# Patient Record
Sex: Female | Born: 1965 | Race: White | Hispanic: No | Smoking: Current every day smoker
Health system: Southern US, Community
[De-identification: ages and names within clinical notes are randomized; demographics above are authoritative.]

## PROBLEM LIST (undated history)

## (undated) DIAGNOSIS — F419 Anxiety disorder, unspecified: Secondary | ICD-10-CM

## (undated) DIAGNOSIS — G8929 Other chronic pain: Secondary | ICD-10-CM

## (undated) DIAGNOSIS — M549 Dorsalgia, unspecified: Secondary | ICD-10-CM

---

## 2009-10-18 HISTORY — PX: TUBAL LIGATION: SHX77

## 2011-08-03 ENCOUNTER — Emergency Department (HOSPITAL_COMMUNITY)
Admission: EM | Admit: 2011-08-03 | Discharge: 2011-08-04 | Disposition: A | Discharge status: Home / Self Care | Payer: Medicaid Other | Attending: Emergency Medicine | Admitting: Emergency Medicine

## 2011-08-03 ENCOUNTER — Emergency Department (HOSPITAL_COMMUNITY): Payer: Medicaid Other

## 2011-08-03 DIAGNOSIS — F17200 Nicotine dependence, unspecified, uncomplicated: Secondary | ICD-10-CM | POA: Insufficient documentation

## 2011-08-03 DIAGNOSIS — K4190 Unilateral femoral hernia, without obstruction or gangrene, not specified as recurrent: Secondary | ICD-10-CM

## 2011-08-03 LAB — CBC
HCT: 34 % — ABNORMAL LOW (ref 36.0–46.0)
Hemoglobin: 11.7 g/dL — ABNORMAL LOW (ref 12.0–15.0)
MCH: 32.3 pg (ref 26.0–34.0)
MCHC: 34.4 g/dL (ref 30.0–36.0)
RBC: 3.62 MIL/uL — ABNORMAL LOW (ref 3.87–5.11)

## 2011-08-03 LAB — URINALYSIS, ROUTINE W REFLEX MICROSCOPIC
Glucose, UA: NEGATIVE mg/dL
Ketones, ur: NEGATIVE mg/dL
Leukocytes, UA: NEGATIVE
Nitrite: NEGATIVE
Protein, ur: NEGATIVE mg/dL
Urobilinogen, UA: 0.2 mg/dL (ref 0.0–1.0)

## 2011-08-03 LAB — COMPREHENSIVE METABOLIC PANEL
Albumin: 3.9 g/dL (ref 3.5–5.2)
Alkaline Phosphatase: 45 U/L (ref 39–117)
BUN: 16 mg/dL (ref 6–23)
Chloride: 106 mEq/L (ref 96–112)
Creatinine, Ser: 0.69 mg/dL (ref 0.50–1.10)
GFR calc Af Amer: >90 mL/min (ref >90)
GFR calc non Af Amer: >90 mL/min (ref >90)
Glucose, Bld: 106 mg/dL — ABNORMAL HIGH (ref 70–99)
Potassium: 4.3 mEq/L (ref 3.5–5.1)
Total Bilirubin: 0.2 mg/dL — ABNORMAL LOW (ref 0.3–1.2)

## 2011-08-03 LAB — POCT PREGNANCY, URINE: Preg Test, Ur: NEGATIVE

## 2011-08-03 LAB — URINE MICROSCOPIC-ADD ON

## 2011-08-03 LAB — DIFFERENTIAL
Basophils Relative: 0 % (ref 0–1)
Lymphs Abs: 4 10*3/uL (ref 0.7–4.0)
Monocytes Absolute: 1 10*3/uL (ref 0.1–1.0)
Monocytes Relative: 10 % (ref 3–12)
Neutro Abs: 4.5 10*3/uL (ref 1.7–7.7)
Neutrophils Relative %: 46 % (ref 43–77)

## 2011-08-03 MED ORDER — HYDROMORPHONE HCL 2 MG/ML IJ SOLN
1.0000 mg | Freq: Once | INTRAMUSCULAR | Status: AC
  Administered 2011-08-03: 1 mg via INTRAVENOUS
  Filled 2011-08-03: qty 1

## 2011-08-03 MED ORDER — ONDANSETRON HCL 4 MG/2ML IJ SOLN
4.0000 mg | Freq: Once | INTRAMUSCULAR | Status: AC
  Administered 2011-08-03: 4 mg via INTRAVENOUS
  Filled 2011-08-03: qty 2

## 2011-08-03 MED ORDER — IOHEXOL 300 MG/ML  SOLN
100.0000 mL | Freq: Once | INTRAMUSCULAR | Status: AC | PRN
  Administered 2011-08-03: 100 mL via INTRAVENOUS

## 2011-08-03 MED ORDER — SODIUM CHLORIDE 0.9 % IV SOLN
INTRAVENOUS | Status: DC
  Administered 2011-08-03: 22:00:00 via INTRAVENOUS

## 2011-08-03 NOTE — ED Notes (Signed)
Pt reports hernia to her pelvic area.  Pt states that it has been there a while but she has always "pushed it back in".  Pt states that this afternoon pain started in the area that feels like "labor pains".  Pt denies any hematuria.

## 2011-08-03 NOTE — ED Provider Notes (Signed)
History     CSN: [id number] Arrival date & time: 08/03/2011  8:29 PM   First MD Initiated Contact with Patient 08/03/11 2040      Chief Complaint  Patient presents with  . Inguinal Hernia    (Consider location/radiation/quality/duration/timing/severity/associated sxs/prior treatment) HPI Comments: The patient has a hernia in her right groin. This has been present for some time. Usually she is able to massage it back into place, for something she does several times a day about 4 hours ago it became painful and would not go back in. In that period of time, she says the pain has gone from a 4 to an 8. She took 3 Advil without relief. He therefore sought evaluation. She has had 2 prior C-sections in the past.  Patient is a 45 y.o. female presenting with abdominal pain.  Abdominal Pain The primary symptoms of the illness include abdominal pain and nausea. The primary symptoms of the illness do not include fever, vomiting, diarrhea, dysuria or vaginal discharge (she is menstruating at present.). The current episode started 3 to 5 hours ago. The onset of the illness was sudden. The problem has been rapidly worsening.  The patient states that she believes she is currently not pregnant. The patient has not had a change in bowel habit. Risk factors for an acute abdominal problem include a history of abdominal surgery. Additional symptoms associated with the illness include anorexia and back pain. Symptoms associated with the illness do not include chills. Associated medical issues comments: He has a known right groin hernia.Marland Kitchen    History reviewed. No pertinent past medical history.  Past Surgical History  Procedure Date  . Cesarean section     No family history on file.  History  Substance Use Topics  . Smoking status: Current Everyday Smoker  . Smokeless tobacco: Not on file  . Alcohol Use: No    OB History    Grav Para Term Preterm Abortions TAB SAB Ect Mult Living                   Review of Systems  Constitutional: Negative.  Negative for fever and chills.  HENT: Negative.   Eyes: Negative.   Respiratory: Negative.   Cardiovascular: Negative.   Gastrointestinal: Positive for nausea, abdominal pain and anorexia. Negative for vomiting and diarrhea.  Genitourinary: Negative.  Negative for dysuria and vaginal discharge (she is menstruating at present.).  Musculoskeletal: Positive for back pain.  Neurological: Negative.   Psychiatric/Behavioral: Negative.     Allergies  Review of patient's allergies indicates no known allergies.  Home Medications   Current Outpatient Rx  Name Route Sig Dispense Refill  . OMEGA-3 FATTY ACIDS 1000 MG PO CAPS Oral Take 1 g by mouth daily.      . IBUPROFEN 200 MG PO TABS Oral Take 600 mg by mouth as needed. For pain     . WOMENS MULTIVITAMIN PLUS PO Oral Take 1 tablet by mouth daily.        BP 115/70  Pulse 94  Temp(Src) 98.1 F (36.7 C) (Oral)  Resp 18  Ht 5\' 7"  (1.702 m)  Wt 120 lb (54.432 kg)  BMI 18.79 kg/m2  SpO2 100%  LMP 08/03/2011  Physical Exam  Nursing note and vitals reviewed. Constitutional: She is oriented to person, place, and time. She appears well-developed and well-nourished.       Patient has a middle-aged woman in moderate distress with a painful right inguinal hernia.  HENT:  Head: Normocephalic  and atraumatic.  Right Ear: External ear normal.  Left Ear: External ear normal.  Mouth/Throat: Oropharynx is clear and moist.  Eyes: Conjunctivae and EOM are normal. Pupils are equal, round, and reactive to light.  Neck: Normal range of motion. Neck supple.  Cardiovascular: Normal rate, regular rhythm and normal heart sounds.   Pulmonary/Chest: Effort normal and breath sounds normal.  Abdominal: There is Tenderness: she has a right inguinal, about 2 cm in diameter, that is tender to palpation There is no surrounding redness or tenderness in the area surrounding the hernia..  Musculoskeletal: Normal  range of motion.  Lymphadenopathy:    She has no cervical adenopathy.  Neurological: She is alert and oriented to person, place, and time.       No sensory or motor deficits.  Skin: Skin is warm and dry.  Psychiatric: She has a normal mood and affect. Her behavior is normal.    ED Course  Procedures (including critical care time)   Labs Reviewed  CBC  DIFFERENTIAL  COMPREHENSIVE METABOLIC PANEL  URINALYSIS, ROUTINE W REFLEX MICROSCOPIC  POCT PREGNANCY, URINE    9:12 PM Patient was seen and had physical examination, which revealed an incarcerated right inguinal hernia. Hyphae pain medication was ordered. Laboratory tests and abdominal CT were ordered. 9:56 PM Results for orders placed during the hospital encounter of 08/03/11  CBC      Component Value Range   WBC 9.7  4.0 - 10.5 (K/uL)   RBC 3.62 (*) 3.87 - 5.11 (MIL/uL)   Hemoglobin 11.7 (*) 12.0 - 15.0 (g/dL)   HCT 16.1 (*) 09.6 - 46.0 (%)   MCV 93.9  78.0 - 100.0 (fL)   MCH 32.3  26.0 - 34.0 (pg)   MCHC 34.4  30.0 - 36.0 (g/dL)   RDW 04.5  40.9 - 81.1 (%)   Platelets 208  150 - 400 (K/uL)  DIFFERENTIAL      Component Value Range   Neutrophils Relative 46  43 - 77 (%)   Neutro Abs 4.5  1.7 - 7.7 (K/uL)   Lymphocytes Relative 41  12 - 46 (%)   Lymphs Abs 4.0  0.7 - 4.0 (K/uL)   Monocytes Relative 10  3 - 12 (%)   Monocytes Absolute 1.0  0.1 - 1.0 (K/uL)   Eosinophils Relative 2  0 - 5 (%)   Eosinophils Absolute 0.2  0.0 - 0.7 (K/uL)   Basophils Relative 0  0 - 1 (%)   Basophils Absolute 0.0  0.0 - 0.1 (K/uL)  URINALYSIS, ROUTINE W REFLEX MICROSCOPIC      Component Value Range   Color, Urine YELLOW  YELLOW    Appearance CLEAR  CLEAR    Specific Gravity, Urine 1.025  1.005 - 1.030    pH 6.5  5.0 - 8.0    Glucose, UA NEGATIVE  NEGATIVE (mg/dL)   Hgb urine dipstick MODERATE (*) NEGATIVE    Bilirubin Urine NEGATIVE  NEGATIVE    Ketones, ur NEGATIVE  NEGATIVE (mg/dL)   Protein, ur NEGATIVE  NEGATIVE (mg/dL)    Urobilinogen, UA 0.2  0.0 - 1.0 (mg/dL)   Nitrite NEGATIVE  NEGATIVE    Leukocytes, UA NEGATIVE  NEGATIVE   POCT PREGNANCY, URINE      Component Value Range   Preg Test, Ur NEGATIVE    URINE MICROSCOPIC-ADD ON      Component Value Range   Squamous Epithelial / LPF MANY (*) RARE    WBC, UA 0-2  <3 (WBC/hpf)  RBC / HPF 7-10  <3 (RBC/hpf)   Bacteria, UA RARE  RARE    Lab tests relatively normal.  Awaiting rest of labs, CT of abdomen. Case discussed with Dr. Jeraldine Loots, who will followup her results.   No results found.    No results found.   No diagnosis found.    MDM     Patient's CT scan demonstrates a right femoral hernia with fat process, and no bowel involvement. The patient was made aware of these findings, and a prolonged discussion was held regarding analgesia, and the necessity for followup care. She was discharged following an additional dose of analgesia.     Gerhard Munch, MD 08/04/11 0010

## 2011-08-04 MED ORDER — HYDROMORPHONE HCL 2 MG/ML IJ SOLN
1.0000 mg | Freq: Once | INTRAMUSCULAR | Status: DC
  Filled 2011-08-04: qty 1

## 2011-08-04 MED ORDER — HYDROCODONE-ACETAMINOPHEN 5-325 MG PO TABS: 2.0000 | ORAL_TABLET | ORAL | Status: DC | PRN

## 2011-08-04 MED ORDER — HYDROCODONE-ACETAMINOPHEN 5-325 MG PO TABS: 2.0000 | ORAL_TABLET | Freq: Three times a day (TID) | ORAL | Status: AC | PRN

## 2011-08-04 NOTE — ED Notes (Signed)
See downtime forms for discharge 

## 2011-08-19 ENCOUNTER — Other Ambulatory Visit (HOSPITAL_COMMUNITY): Payer: Self-pay | Admitting: Internal Medicine

## 2011-08-19 DIAGNOSIS — Z139 Encounter for screening, unspecified: Secondary | ICD-10-CM

## 2011-08-23 ENCOUNTER — Ambulatory Visit (HOSPITAL_COMMUNITY)
Admission: RE | Admit: 2011-08-23 | Discharge: 2011-08-23 | Disposition: A | Discharge status: Home / Self Care | Payer: Medicaid Other | Source: Ambulatory Visit | Attending: Internal Medicine | Admitting: Internal Medicine

## 2011-08-23 ENCOUNTER — Other Ambulatory Visit (HOSPITAL_COMMUNITY): Payer: Self-pay | Admitting: Internal Medicine

## 2011-08-23 DIAGNOSIS — Z139 Encounter for screening, unspecified: Secondary | ICD-10-CM

## 2011-08-23 DIAGNOSIS — M51379 Other intervertebral disc degeneration, lumbosacral region without mention of lumbar back pain or lower extremity pain: Secondary | ICD-10-CM | POA: Insufficient documentation

## 2011-08-23 DIAGNOSIS — Z1231 Encounter for screening mammogram for malignant neoplasm of breast: Secondary | ICD-10-CM | POA: Insufficient documentation

## 2011-08-23 DIAGNOSIS — K469 Unspecified abdominal hernia without obstruction or gangrene: Secondary | ICD-10-CM

## 2011-08-23 DIAGNOSIS — M545 Low back pain, unspecified: Secondary | ICD-10-CM | POA: Insufficient documentation

## 2011-08-23 DIAGNOSIS — M5137 Other intervertebral disc degeneration, lumbosacral region: Secondary | ICD-10-CM | POA: Insufficient documentation

## 2011-09-01 ENCOUNTER — Encounter (HOSPITAL_COMMUNITY): Payer: Self-pay | Admitting: Pharmacy Technician

## 2011-09-13 ENCOUNTER — Encounter (HOSPITAL_COMMUNITY): Payer: Self-pay

## 2011-09-13 ENCOUNTER — Encounter (HOSPITAL_COMMUNITY)
Admission: RE | Admit: 2011-09-13 | Discharge: 2011-09-13 | Disposition: A | Discharge status: Home / Self Care | Payer: Medicaid Other | Source: Ambulatory Visit | Attending: General Surgery | Admitting: General Surgery

## 2011-09-13 HISTORY — DX: Anxiety disorder, unspecified: F41.9

## 2011-09-13 LAB — SURGICAL PCR SCREEN
MRSA, PCR: NEGATIVE
Staphylococcus aureus: NEGATIVE

## 2011-09-13 LAB — CBC
HCT: 36.5 % (ref 36.0–46.0)
Hemoglobin: 12.3 g/dL (ref 12.0–15.0)
MCH: 31.5 pg (ref 26.0–34.0)
MCHC: 33.7 g/dL (ref 30.0–36.0)
RBC: 3.91 MIL/uL (ref 3.87–5.11)

## 2011-09-13 LAB — BASIC METABOLIC PANEL
BUN: 8 mg/dL (ref 6–23)
CO2: 26 mEq/L (ref 19–32)
Calcium: 9.8 mg/dL (ref 8.4–10.5)
Glucose, Bld: 89 mg/dL (ref 70–99)
Sodium: 138 mEq/L (ref 135–145)

## 2011-09-13 NOTE — Patient Instructions (Addendum)
22 Ana Austin  09/13/2011   Your procedure is scheduled on:  09/15/2011  Report to [facility] at  615  AM.  Call this number if you have problems the morning of surgery: [phone]   Remember:   Do not eat food:After Midnight.  May have clear liquids:until Midnight .  Clear liquids include soda, tea, black coffee, apple or grape juice, broth.  Take these medicines the morning of surgery with A SIP OF WATER: none   Do not wear jewelry, make-up or nail polish.  Do not wear lotions, powders, or perfumes. You may wear deodorant.  Do not shave 48 hours prior to surgery.  Do not bring valuables to the hospital.  Contacts, dentures or bridgework may not be worn into surgery.  Leave suitcase in the car. After surgery it may be brought to your room.  For patients admitted to the hospital, checkout time is 11:00 AM the day of discharge.   Patients discharged the day of surgery will not be allowed to drive home.  Name and phone number of your driver: family  Special Instructions: CHG Shower Use Special Wash: 1/2 bottle night before surgery and 1/2 bottle morning of surgery.   Please read over the following fact sheets that you were given: Pain Booklet, MRSA Information, Surgical Site Infection Prevention, Anesthesia Post-op Instructions and Care and Recovery After Surgery PATIENT INSTRUCTIONS POST-ANESTHESIA  IMMEDIATELY FOLLOWING SURGERY:  Do not drive or operate machinery for the first twenty four hours after surgery.  Do not make any important decisions for twenty four hours after surgery or while taking narcotic pain medications or sedatives.  If you develop intractable nausea and vomiting or a severe headache please notify your doctor immediately.  FOLLOW-UP:  Please make an appointment with your surgeon as instructed. You do not need to follow up with anesthesia unless specifically instructed to do so.  WOUND CARE INSTRUCTIONS (if applicable):  Keep a dry clean dressing on the  anesthesia/puncture wound site if there is drainage.  Once the wound has quit draining you may leave it open to air.  Generally you should leave the bandage intact for twenty four hours unless there is drainage.  If the epidural site drains for more than 36-48 hours please call the anesthesia department.  QUESTIONS?:  Please feel free to call your physician or the hospital operator if you have any questions, and they will be happy to assist you.     [facility] [address] [place] [phone]

## 2011-09-14 NOTE — H&P (Signed)
  NTS SOAP Note  Vital Signs:  Vitals as of: 08/12/2011: Systolic 100: Diastolic 56: Heart Rate 63: Temp 97.54F: Height 45ft 7in: Weight 128Lbs 0 Ounces: Pain Level 0: BMI 20  BMI : 20.05 kg/m2  Subjective: This 26 Years 86 Months old Female presents for of right groin buldge.  Patient has noted a bulge over the last several years.  Slow increase in the size. NO significant issues except for last week when she went to the ED.  Had significant pain at that time.  Apparently was reduced in the ED.  No symptoms since.  No fever or chills.  No change in BM.  Review of Symptoms:  Constitutional:unremarkable Head:unremarkable Eyes:unremarkable Nose/Mouth/Throat:unremarkable Cardiovascular:unremarkable Respiratory:unremarkable as per HPI Genitourinary:unremarkable Musculoskeletal:unremarkable Skin:unremarkable Breast:unremarkable Hematolgic/Lymphatic:unremarkable Allergic/Immunologic:unremarkable   Past Medical History:Obtained   Past Medical History  Pregnancy Gravida: 4 Pregnancy Para: 4 Surgical History: c-section x2 Medical Problems: none Psychiatric History: none Allergies: NKDA Medications: none   Social History:Obtained   Social History  Preferred Language: English ([place]) Race:  White Ethnicity: Not Hispanic / Latino Age: 13 Years 4 Months Marital Status:  M Alcohol: no Recreational drug(s): no   Smoking Status: Unknown if ever smoked  Family History:Obtained   Family History  Is there a family history of:non-contributory    Objective Information: General:Well appearing, well nourished in no distress. Skin:no rash or prominent lesions Head:Atraumatic; no masses; no abnormalities Eyes:conjunctiva clear, EOM intact, PERRL Mouth:Mucous membranes moist, no mucosal lesions. Neck:Supple without lymphadenopathy.  Heart:RRR, no murmur Lungs:CTA bilaterally, no wheezes, rhonchi,  rales.  Breathing unlabored. Abdomen:Soft, NT/ND, no HSM, no masses.+r femoral hernia Extremities:No deformities, clubbing, cyanosis, or edema.   Assessment:  Diagnosis &amp; Procedure: DiagnosisCode: 553.00, ProcedureCode: 84132,    Plan: Femoral hernia.  Risks, benefits, alternatives discussed with patient.  Patient will consider timing.  She will call.  SSx of incar/strang discussed.  Patient Education:Alternative treatments to surgery were discussed with patient (and family).Risks and benefits  of procedure were fully explained to the patient (and family) who gave informed consent. Patient/family questions were addressed.  Follow-up:PRN

## 2011-09-15 ENCOUNTER — Encounter (HOSPITAL_COMMUNITY): Payer: Self-pay

## 2011-09-15 ENCOUNTER — Encounter (HOSPITAL_COMMUNITY): Payer: Self-pay | Admitting: Anesthesiology

## 2011-09-15 ENCOUNTER — Encounter (HOSPITAL_COMMUNITY)
Admission: RE | Disposition: A | Discharge status: Home / Self Care | Payer: Self-pay | Source: Ambulatory Visit | Attending: General Surgery

## 2011-09-15 ENCOUNTER — Ambulatory Visit (HOSPITAL_COMMUNITY): Payer: Medicaid Other | Admitting: Anesthesiology

## 2011-09-15 ENCOUNTER — Ambulatory Visit (HOSPITAL_COMMUNITY)
Admission: RE | Admit: 2011-09-15 | Discharge: 2011-09-15 | Disposition: A | Discharge status: Home / Self Care | Payer: Medicaid Other | Source: Ambulatory Visit | Attending: General Surgery | Admitting: General Surgery

## 2011-09-15 DIAGNOSIS — K4190 Unilateral femoral hernia, without obstruction or gangrene, not specified as recurrent: Secondary | ICD-10-CM | POA: Insufficient documentation

## 2011-09-15 HISTORY — DX: Other chronic pain: G89.29

## 2011-09-15 HISTORY — DX: Dorsalgia, unspecified: M54.9

## 2011-09-15 HISTORY — PX: FEMORAL HERNIA REPAIR: SHX632

## 2011-09-15 SURGERY — HERNIA REPAIR FEMORAL
Anesthesia: General | Site: Groin | Laterality: Right | Wound class: Clean

## 2011-09-15 MED ORDER — FENTANYL CITRATE 0.05 MG/ML IJ SOLN
INTRAMUSCULAR | Status: AC
  Filled 2011-09-15: qty 2

## 2011-09-15 MED ORDER — BUPIVACAINE HCL (PF) 0.5 % IJ SOLN
INTRAMUSCULAR | Status: DC | PRN
  Administered 2011-09-15: 10 mL

## 2011-09-15 MED ORDER — LIDOCAINE HCL 1 % IJ SOLN
INTRAMUSCULAR | Status: DC | PRN
  Administered 2011-09-15: 40 mg via INTRADERMAL

## 2011-09-15 MED ORDER — FENTANYL CITRATE 0.05 MG/ML IJ SOLN
INTRAMUSCULAR | Status: AC
  Administered 2011-09-15: 50 ug via INTRAVENOUS
  Filled 2011-09-15: qty 2

## 2011-09-15 MED ORDER — PROPOFOL 10 MG/ML IV EMUL
INTRAVENOUS | Status: AC
  Filled 2011-09-15: qty 20

## 2011-09-15 MED ORDER — ENOXAPARIN SODIUM 40 MG/0.4ML ~~LOC~~ SOLN
40.0000 mg | Freq: Once | SUBCUTANEOUS | Status: AC
  Administered 2011-09-15: 40 mg via SUBCUTANEOUS

## 2011-09-15 MED ORDER — LIDOCAINE HCL (PF) 1 % IJ SOLN
INTRAMUSCULAR | Status: AC
  Filled 2011-09-15: qty 5

## 2011-09-15 MED ORDER — CELECOXIB 100 MG PO CAPS
ORAL_CAPSULE | ORAL | Status: AC
  Filled 2011-09-15: qty 4

## 2011-09-15 MED ORDER — MIDAZOLAM HCL 2 MG/2ML IJ SOLN
INTRAMUSCULAR | Status: AC
  Filled 2011-09-15: qty 2

## 2011-09-15 MED ORDER — FENTANYL CITRATE 0.05 MG/ML IJ SOLN: 12.5000 ug | INTRAMUSCULAR | Status: DC | PRN

## 2011-09-15 MED ORDER — LACTATED RINGERS IV SOLN
INTRAVENOUS | Status: DC
Start: 2011-09-15 — End: 2011-09-15
  Administered 2011-09-15 (×2): via INTRAVENOUS

## 2011-09-15 MED ORDER — FENTANYL CITRATE 0.05 MG/ML IJ SOLN
25.0000 ug | INTRAMUSCULAR | Status: DC | PRN
  Administered 2011-09-15 (×2): 50 ug via INTRAVENOUS

## 2011-09-15 MED ORDER — MIDAZOLAM HCL 2 MG/2ML IJ SOLN
1.0000 mg | INTRAMUSCULAR | Status: DC | PRN
Start: 2011-09-15 — End: 2011-09-15
  Administered 2011-09-15: 2 mg via INTRAVENOUS

## 2011-09-15 MED ORDER — BUPIVACAINE HCL (PF) 0.5 % IJ SOLN
INTRAMUSCULAR | Status: AC
  Filled 2011-09-15: qty 30

## 2011-09-15 MED ORDER — FENTANYL CITRATE 0.05 MG/ML IJ SOLN
INTRAMUSCULAR | Status: DC | PRN
  Administered 2011-09-15: 50 ug via INTRAVENOUS
  Administered 2011-09-15: 25 ug via INTRAVENOUS
  Administered 2011-09-15 (×3): 50 ug via INTRAVENOUS
  Administered 2011-09-15: 25 ug via INTRAVENOUS
  Administered 2011-09-15: 50 ug via INTRAVENOUS

## 2011-09-15 MED ORDER — SODIUM CHLORIDE 0.9 % IR SOLN
Status: DC | PRN
  Administered 2011-09-15: 1

## 2011-09-15 MED ORDER — CEFAZOLIN SODIUM 1-5 GM-% IV SOLN: 1.0000 g | INTRAVENOUS | Status: DC

## 2011-09-15 MED ORDER — CEFAZOLIN SODIUM 1-5 GM-% IV SOLN
INTRAVENOUS | Status: DC | PRN
  Administered 2011-09-15: 1 g via INTRAVENOUS

## 2011-09-15 MED ORDER — ONDANSETRON HCL 4 MG/2ML IJ SOLN: 4.0000 mg | Freq: Once | INTRAMUSCULAR | Status: DC | PRN

## 2011-09-15 MED ORDER — HYDROCODONE-ACETAMINOPHEN 5-325 MG PO TABS: 1.0000 | ORAL_TABLET | ORAL | Status: AC | PRN

## 2011-09-15 MED ORDER — CELECOXIB 100 MG PO CAPS
400.0000 mg | ORAL_CAPSULE | Freq: Every day | ORAL | Status: AC
  Administered 2011-09-15: 400 mg via ORAL

## 2011-09-15 MED ORDER — ENOXAPARIN SODIUM 40 MG/0.4ML ~~LOC~~ SOLN
SUBCUTANEOUS | Status: AC
  Administered 2011-09-15: 40 mg via SUBCUTANEOUS
  Filled 2011-09-15: qty 0.4

## 2011-09-15 MED ORDER — PROPOFOL 10 MG/ML IV EMUL
INTRAVENOUS | Status: DC | PRN
  Administered 2011-09-15: 20 mL via INTRAVENOUS

## 2011-09-15 SURGICAL SUPPLY — 42 items
BAG HAMPER (MISCELLANEOUS) ×2 IMPLANT
BENZOIN TINCTURE PRP APPL 2/3 (GAUZE/BANDAGES/DRESSINGS) ×2 IMPLANT
CLOSURE STERI STRIP 1/2 X4 (GAUZE/BANDAGES/DRESSINGS) ×2 IMPLANT
CLOTH BEACON ORANGE TIMEOUT ST (SAFETY) ×2 IMPLANT
COVER LIGHT HANDLE STERIS (MISCELLANEOUS) ×4 IMPLANT
DECANTER SPIKE VIAL GLASS SM (MISCELLANEOUS) ×2 IMPLANT
DRAIN PENROSE 18X.75 LTX STRL (MISCELLANEOUS) ×2 IMPLANT
DURAPREP 26ML APPLICATOR (WOUND CARE) ×2 IMPLANT
ELECT REM PT RETURN 9FT ADLT (ELECTROSURGICAL) ×2
ELECTRODE REM PT RTRN 9FT ADLT (ELECTROSURGICAL) ×1 IMPLANT
FORMALIN 10 PREFIL 120ML (MISCELLANEOUS) ×2 IMPLANT
GLOVE BIOGEL PI IND STRL 7.0 (GLOVE) ×3 IMPLANT
GLOVE BIOGEL PI INDICATOR 7.0 (GLOVE) ×3
GLOVE ECLIPSE 6.5 STRL STRAW (GLOVE) ×2 IMPLANT
GLOVE ECLIPSE 7.0 STRL STRAW (GLOVE) ×2 IMPLANT
GLOVE INDICATOR 7.5 STRL GRN (GLOVE) ×2 IMPLANT
GLOVE SS BIOGEL STRL SZ 6.5 (GLOVE) ×2 IMPLANT
GLOVE SUPERSENSE BIOGEL SZ 6.5 (GLOVE) ×2
GOWN STRL REIN XL XLG (GOWN DISPOSABLE) ×8 IMPLANT
INST SET MINOR GENERAL (KITS) ×2 IMPLANT
KIT ROOM TURNOVER APOR (KITS) ×2 IMPLANT
MANIFOLD NEPTUNE II (INSTRUMENTS) ×2 IMPLANT
MESH MARLEX PLUG MEDIUM (Mesh General) ×2 IMPLANT
NEEDLE HYPO 25X1 1.5 SAFETY (NEEDLE) ×2 IMPLANT
NS IRRIG 1000ML POUR BTL (IV SOLUTION) ×2 IMPLANT
PACK MINOR (CUSTOM PROCEDURE TRAY) ×2 IMPLANT
PAD ARMBOARD 7.5X6 YLW CONV (MISCELLANEOUS) ×2 IMPLANT
SET BASIN LINEN APH (SET/KITS/TRAYS/PACK) ×2 IMPLANT
STRIP CLOSURE SKIN 1/2X4 (GAUZE/BANDAGES/DRESSINGS) ×2 IMPLANT
SUT ETHIBOND 0 MO6 C/R (SUTURE) ×2 IMPLANT
SUT MNCRL AB 4-0 PS2 18 (SUTURE) ×2 IMPLANT
SUT NOVA NAB GS-22 2 2-0 T-19 (SUTURE) IMPLANT
SUT NOVAFIL NAB HGS22 2-0 30IN (SUTURE) IMPLANT
SUT SILK 3 0 (SUTURE)
SUT SILK 3-0 18XBRD TIE 12 (SUTURE) IMPLANT
SUT VIC AB 2-0 CT1 27 (SUTURE) ×1
SUT VIC AB 2-0 CT1 TAPERPNT 27 (SUTURE) ×1 IMPLANT
SUT VIC AB 3-0 SH 27 (SUTURE) ×1
SUT VIC AB 3-0 SH 27X BRD (SUTURE) ×1 IMPLANT
SUT VICRYL AB 3 0 TIES (SUTURE) IMPLANT
SYR CONTROL 10ML LL (SYRINGE) ×2 IMPLANT
TOWEL OR 17X26 4PK STRL BLUE (TOWEL DISPOSABLE) ×2 IMPLANT

## 2011-09-15 NOTE — Anesthesia Preprocedure Evaluation (Addendum)
Anesthesia Evaluation  Patient identified by MRN, date of birth, ID band Patient awake    Reviewed: Allergy & Precautions, H&P , NPO status , Patient's Chart, lab work & pertinent test results  History of Anesthesia Complications Negative for: history of anesthetic complications  Airway Mallampati: I TM Distance: >3 FB     Dental  (+) Chipped,    Pulmonary Current Smoker,    Pulmonary exam normal       Cardiovascular Regular Normal    Neuro/Psych PSYCHIATRIC DISORDERS Anxiety    GI/Hepatic   Endo/Other    Renal/GU      Musculoskeletal   Abdominal   Peds  Hematology   Anesthesia Other Findings   Reproductive/Obstetrics                           Anesthesia Physical Anesthesia Plan  ASA: II  Anesthesia Plan: General   Post-op Pain Management:    Induction: Intravenous  Airway Management Planned: LMA  Additional Equipment:   Intra-op Plan:   Post-operative Plan:   Informed Consent: I have reviewed the patients History and Physical, chart, labs and discussed the procedure including the risks, benefits and alternatives for the proposed anesthesia with the patient or authorized representative who has indicated his/her understanding and acceptance.     Plan Discussed with:   Anesthesia Plan Comments:         Anesthesia Quick Evaluation

## 2011-09-15 NOTE — Op Note (Signed)
Patient:  Ana Austin  DOB:  [birth year]  MRN:  [id number]   Preop Diagnosis:  Right femoral hernia  Postop Diagnosis:  The same  Procedure:  Right femoral hernia repair with mesh  Surgeon:  Dr. Tilford Pillar  Anes:  General endotracheal, 1% lidocaine plain  Indications:  Patient is a 45 year old female presented to my office with a history of discomfort in the right groin. Workup in the emergency department had demonstrated a right femoral hernia and CT evaluation. At that time she was evaluated in my office the hernia was reduced. Risks benefits alternatives of a femoral hernia repair with mesh were discussed at length patient including but not limited to risk of bleeding, infection, infection and mesh requiring removal and subsequent repair, paresthesia, chronic pain symptoms. Her questions and concerns were addressed the patient was consented for the planned procedure.  Procedure note:  Patient was taken to the or was placed in supine position on the OR table. This point the general anesthetic is administered the patient was endotracheally intubated by the nurse anesthetist. At this point her abdomen and right thigh are prepped with DuraPrep solution and draped in standard fashion. I did create the right inguinal incision slightly higher to incorporate her previous cesarean section scar. Additional dissection down to subcuticular tissues carried out using electrocautery. The external oblique fascia was identified and was scored with a 15 blade scalpel. This was opened medially towards the external inguinal ring with Metzenbaum scissors. At this point the round ligament was identified and was dissected behind using blunt digital dissection over the pubic tubercle. I did use a Ray-Tec sponge to help elevate the round ligament out of the field. At this point continue my dissection down along the inguinal ligament along the shelving portion and identified the right femoral vein. A continue my  dissection medially and was able to identify the hernia sac. This was pulled from the femoral canal and reduced back into the abdominal cavity. The femoral canal was closed medially adjacent to the femoral vein using 0 Ethibond sutures to tack the shelving portion of the inguinal ligament to Poupart's ligament. A medium mesh plug was then inserted towards the peritoneal cavity to help maintain reduction of the femoral sac. This was pexed to the shelving portion inferiorly with the 0 Ethibond sutures. Mesh onlay was then placed with the mesh pexed medially to the connective tissue over the pubic tubercle, inferiorly to the shelving portion of the inguinal ligament, superiorly to the conjoined tendon. Pexing sutures were 0 Ethibond suture in simple or fashion. At this time I also divided the round ligament ligating both ends with an 0 Ethibond suture. The tails of the mesh were tucked under the external oblique fascia laterally and the field was irrigated. As quite pleased with the appearance of the repair at this time I turned my attention to closure.  A 2-0 Vicryl suture was utilized to reapproximate the external oblique fascia. A 3-0 Vicryl was then utilized to reapproximate Scarpa's fascia. A 4-0 Monocryl was utilized reapproximate the skin edges in a running subcuticular suture. The skin was washed dried moist dry towel after the local anesthetic was instilled. Benzoin is applied around the incision and half-inch Steri-Strips are placed. At this point the drapes removed the patient was allowed to come out of general anesthetic was transferred to the postanesthetic care unit in stable condition. At the conclusion of the procedure all instrument, sponge, needle counts are correct. Patient tolerated procedure extremely well.  Complications:  None  EBL:  Scant  Specimen:  None

## 2011-09-15 NOTE — Anesthesia Procedure Notes (Signed)
Procedure Name: LMA Insertion Date/Time: 09/15/2011 8:24 AM Performed by: Glynn Octave Pre-anesthesia Checklist: Patient identified, Patient being monitored, Emergency Drugs available, Timeout performed and Suction available Patient Re-evaluated:Patient Re-evaluated prior to inductionOxygen Delivery Method: Circle System Utilized Preoxygenation: Pre-oxygenation with 100% oxygen Intubation Type: IV induction Ventilation: Mask ventilation without difficulty LMA: LMA inserted LMA Size: 3.0 Number of attempts: 1 Placement Confirmation: positive ETCO2 and breath sounds checked- equal and bilateral Tube secured with: Tape

## 2011-09-15 NOTE — Anesthesia Postprocedure Evaluation (Signed)
  Anesthesia Post-op Note  Patient: Ana Austin  Procedure(s) Performed:  HERNIA REPAIR FEMORAL - WITH MESH   Patient Location: PACU  Anesthesia Type: General  Level of Consciousness: awake, alert  and oriented  Airway and Oxygen Therapy: Patient Spontanous Breathing and Patient connected to face mask oxygen  Post-op Pain: mild  Post-op Assessment: Post-op Vital signs reviewed, Patient's Cardiovascular Status Stable, Respiratory Function Stable and No signs of Nausea or vomiting  Post-op Vital Signs: Reviewed and stable  Complications: No apparent anesthesia complications

## 2011-09-15 NOTE — Interval H&P Note (Signed)
History and Physical Interval Note:   09/15/2011   7:55 AM   Ana Austin  has presented today for surgery, with the diagnosis of Right Femoral hernia [553.00]  The various methods of treatment have been discussed with the patient and family. After consideration of risks, benefits and other options for treatment, the patient has consented to  Procedure(s): HERNIA REPAIR FEMORAL as a surgical intervention .  The patients' history has been reviewed, patient examined, no change in status, stable for surgery.  I have reviewed the patients' chart and labs.  Questions were answered to the patient's satisfaction.     Fabio Bering  MD

## 2011-09-15 NOTE — Transfer of Care (Signed)
Immediate Anesthesia Transfer of Care Note  Patient: Ana Austin  Procedure(s) Performed:  HERNIA REPAIR FEMORAL - WITH MESH   Patient Location: PACU  Anesthesia Type: General  Level of Consciousness: awake, alert  and oriented  Airway & Oxygen Therapy: Patient Spontanous Breathing and Patient connected to face mask oxygen  Post-op Assessment: Report given to PACU RN  Post vital signs: Reviewed and stable  Complications: No apparent anesthesia complications

## 2011-09-21 ENCOUNTER — Encounter (HOSPITAL_COMMUNITY): Payer: Self-pay | Admitting: General Surgery

## 2011-10-21 ENCOUNTER — Ambulatory Visit (HOSPITAL_COMMUNITY): Payer: Medicaid Other | Admitting: Physical Therapy

## 2011-10-21 ENCOUNTER — Ambulatory Visit (HOSPITAL_COMMUNITY): Payer: Medicaid Other

## 2011-10-26 ENCOUNTER — Ambulatory Visit (HOSPITAL_COMMUNITY): Payer: Medicaid Other

## 2011-11-02 ENCOUNTER — Ambulatory Visit (HOSPITAL_COMMUNITY): Payer: Medicaid Other

## 2012-01-06 ENCOUNTER — Other Ambulatory Visit (HOSPITAL_COMMUNITY)
Admission: RE | Admit: 2012-01-06 | Discharge: 2012-01-06 | Disposition: A | Discharge status: Home / Self Care | Payer: Medicaid Other | Source: Ambulatory Visit | Attending: General Surgery | Admitting: General Surgery

## 2012-01-06 DIAGNOSIS — D235 Other benign neoplasm of skin of trunk: Secondary | ICD-10-CM | POA: Insufficient documentation

## 2012-01-11 ENCOUNTER — Other Ambulatory Visit: Payer: Self-pay | Admitting: Family Medicine

## 2012-01-11 DIAGNOSIS — M545 Low back pain: Secondary | ICD-10-CM

## 2012-01-20 ENCOUNTER — Ambulatory Visit (HOSPITAL_COMMUNITY): Admission: RE | Admit: 2012-01-20 | Payer: Medicaid Other | Source: Ambulatory Visit

## 2012-01-21 ENCOUNTER — Ambulatory Visit (HOSPITAL_COMMUNITY)
Admission: RE | Admit: 2012-01-21 | Discharge: 2012-01-21 | Disposition: A | Discharge status: Home / Self Care | Payer: Medicaid Other | Source: Ambulatory Visit | Attending: Family Medicine | Admitting: Family Medicine

## 2012-01-21 DIAGNOSIS — M545 Low back pain, unspecified: Secondary | ICD-10-CM | POA: Insufficient documentation

## 2012-01-21 DIAGNOSIS — M51379 Other intervertebral disc degeneration, lumbosacral region without mention of lumbar back pain or lower extremity pain: Secondary | ICD-10-CM | POA: Insufficient documentation

## 2012-01-21 DIAGNOSIS — M79609 Pain in unspecified limb: Secondary | ICD-10-CM | POA: Insufficient documentation

## 2012-01-21 DIAGNOSIS — M5137 Other intervertebral disc degeneration, lumbosacral region: Secondary | ICD-10-CM | POA: Insufficient documentation

## 2012-01-24 ENCOUNTER — Ambulatory Visit: Payer: Medicaid Other | Admitting: Physical Therapy

## 2012-01-26 ENCOUNTER — Ambulatory Visit: Payer: Medicaid Other | Attending: Family Medicine | Admitting: Physical Therapy

## 2012-01-26 DIAGNOSIS — M545 Low back pain, unspecified: Secondary | ICD-10-CM | POA: Insufficient documentation

## 2012-01-26 DIAGNOSIS — IMO0001 Reserved for inherently not codable concepts without codable children: Secondary | ICD-10-CM | POA: Insufficient documentation

## 2012-05-21 ENCOUNTER — Emergency Department (HOSPITAL_COMMUNITY)
Admission: EM | Admit: 2012-05-21 | Discharge: 2012-05-21 | Disposition: A | Discharge status: Home / Self Care | Payer: Self-pay | Attending: Emergency Medicine | Admitting: Emergency Medicine

## 2012-05-21 ENCOUNTER — Encounter (HOSPITAL_COMMUNITY): Payer: Self-pay | Admitting: *Deleted

## 2012-05-21 DIAGNOSIS — G8929 Other chronic pain: Secondary | ICD-10-CM | POA: Insufficient documentation

## 2012-05-21 DIAGNOSIS — X58XXXA Exposure to other specified factors, initial encounter: Secondary | ICD-10-CM | POA: Insufficient documentation

## 2012-05-21 DIAGNOSIS — S39012A Strain of muscle, fascia and tendon of lower back, initial encounter: Secondary | ICD-10-CM

## 2012-05-21 DIAGNOSIS — F411 Generalized anxiety disorder: Secondary | ICD-10-CM | POA: Insufficient documentation

## 2012-05-21 DIAGNOSIS — F17200 Nicotine dependence, unspecified, uncomplicated: Secondary | ICD-10-CM | POA: Insufficient documentation

## 2012-05-21 DIAGNOSIS — S335XXA Sprain of ligaments of lumbar spine, initial encounter: Secondary | ICD-10-CM | POA: Insufficient documentation

## 2012-05-21 MED ORDER — BACLOFEN 10 MG PO TABS: 10.0000 mg | ORAL_TABLET | Freq: Three times a day (TID) | ORAL | Status: AC

## 2012-05-21 MED ORDER — HYDROCODONE-ACETAMINOPHEN 5-325 MG PO TABS: 1.0000 | ORAL_TABLET | ORAL | Status: AC | PRN

## 2012-05-21 NOTE — ED Notes (Signed)
Pt c/o lower back pain off and on x 10 years but worse in the last 3 days. Pt states that tylenol or ibuprofen are not helping.

## 2012-05-21 NOTE — ED Provider Notes (Signed)
History     CSN: [id number]  Arrival date & time 05/21/12  1024   First MD Initiated Contact with Patient 05/21/12 1035      Chief Complaint  Patient presents with  . Back Pain    (Consider location/radiation/quality/duration/timing/severity/associated sxs/prior treatment) HPI Comments: Ana Austin  presents with acute on chronic low back pain which has which has been present for the past 3 days.   Patient denies any new injury specifically but is very active chasing after her young children.  There is radiation into the the left buttock and right lateral flank.  There has been no weakness or numbness in the lower extremities and no urinary or bowel retention or incontinence.  Patient does not have a history of cancer or IVDU.   The history is provided by the patient.    Past Medical History  Diagnosis Date  . Anxiety   . Chronic back pain     Past Surgical History  Procedure Date  . Cesarean section 2010 and 2011    [place], [place]  . Tubal ligation 2011    with c-section  . Femoral hernia repair 09/15/2011    Procedure: HERNIA REPAIR FEMORAL;  Surgeon: Fabio Bering;  Location: [facility];  Service: General;  Laterality: Right;  WITH MESH     Family History  Problem Relation Age of Onset  . Anesthesia problems Neg Hx   . Hypotension Neg Hx   . Malignant hyperthermia Neg Hx   . Pseudochol deficiency Neg Hx     History  Substance Use Topics  . Smoking status: Current Everyday Smoker -- 0.2 packs/day for 20 years    Types: Cigarettes  . Smokeless tobacco: Not on file  . Alcohol Use: No    OB History    Grav Para Term Preterm Abortions TAB SAB Ect Mult Living                  Review of Systems  Constitutional: Negative for fever.  Respiratory: Negative for shortness of breath.   Cardiovascular: Negative for chest pain and leg swelling.  Gastrointestinal: Negative for abdominal pain, constipation and abdominal distention.  Genitourinary: Negative for dysuria,  urgency, frequency, flank pain and difficulty urinating.  Musculoskeletal: Positive for back pain. Negative for joint swelling and gait problem.  Skin: Negative for rash.  Neurological: Negative for weakness and numbness.    Allergies  Review of patient's allergies indicates no known allergies.  Home Medications   Current Outpatient Rx  Name Route Sig Dispense Refill  . ACETAMINOPHEN 500 MG PO TABS Oral Take 1,000 mg by mouth every 6 (six) hours as needed. For pain    . CLONAZEPAM 1 MG PO TABS Oral Take 1 mg by mouth 2 (two) times daily as needed. For pain    . OMEGA-3 FATTY ACIDS 1000 MG PO CAPS Oral Take 1 g by mouth daily.      . IBUPROFEN 200 MG PO TABS Oral Take 600 mg by mouth as needed. For pain     . MIRTAZAPINE 30 MG PO TABS Oral Take 30 mg by mouth at bedtime.    . WOMENS MULTIVITAMIN PLUS PO Oral Take 1 tablet by mouth daily.      . TRAZODONE HCL 50 MG PO TABS Oral Take 50 mg by mouth at bedtime.    Marland Kitchen BACLOFEN 10 MG PO TABS Oral Take 1 tablet (10 mg total) by mouth 3 (three) times daily. 15 each 0  . HYDROCODONE-ACETAMINOPHEN 5-325 MG  PO TABS Oral Take 1 tablet by mouth every 4 (four) hours as needed for pain. 15 tablet 0    BP 103/67  Pulse 81  Temp 98.1 F (36.7 C) (Oral)  Resp 16  Ht 5\' 8"  (1.727 m)  Wt 145 lb (65.772 kg)  BMI 22.05 kg/m2  SpO2 98%  LMP 05/18/2012  Physical Exam  Nursing note and vitals reviewed. Constitutional: She appears well-developed and well-nourished.  HENT:  Head: Normocephalic.  Eyes: Conjunctivae are normal.  Neck: Normal range of motion. Neck supple.  Cardiovascular: Normal rate and intact distal pulses.        Pedal pulses normal.  Pulmonary/Chest: Effort normal.  Abdominal: Soft. Bowel sounds are normal. She exhibits no distension and no mass.  Musculoskeletal: Normal range of motion. She exhibits no edema.       Right shoulder: She exhibits tenderness. She exhibits no bony tenderness, no swelling and no deformity.        Lumbar back: She exhibits tenderness. She exhibits no swelling, no edema and no spasm.       Arms: Neurological: She is alert. She has normal strength. She displays no atrophy and no tremor. No sensory deficit. Gait normal.  Reflex Scores:      Patellar reflexes are 2+ on the right side and 2+ on the left side.      Achilles reflexes are 2+ on the right side and 2+ on the left side.      No strength deficit noted in hip and knee flexor and extensor muscle groups.  Ankle flexion and extension intact.  Skin: Skin is warm and dry.  Psychiatric: She has a normal mood and affect.    ED Course  Procedures (including critical care time)  Labs Reviewed - No data to display No results found.   1. Lumbar strain     MRI from 4/13 reviewed - disk herniation at L5-S1 with no impingement  MDM  Pt to continue ibuprofen,  Add baclofen,  Hydrocodone,  Risk of sedation discussed.  Heat to low back 20 minutes 4 times daily.  Pt plans to see Dr. Gerilyn Pilgrim for chronic pain management,  Referral given.        Burgess Amor, [place] 05/21/12 [phone]

## 2012-05-21 NOTE — ED Provider Notes (Signed)
Medical screening examination/treatment/procedure(s) were performed by non-physician practitioner and as supervising physician I was immediately available for consultation/collaboration.   Joya Gaskins, MD 05/21/12 [phone]

## 2012-05-21 NOTE — ED Notes (Signed)
Pt alert and oriented x 3. Skin warm and dry. Color pink. Able to ambulate with no difficulty. No acute distress.

## 2012-06-01 ENCOUNTER — Encounter (HOSPITAL_COMMUNITY): Payer: Self-pay | Admitting: Emergency Medicine

## 2012-06-01 ENCOUNTER — Emergency Department (HOSPITAL_COMMUNITY)
Admission: EM | Admit: 2012-06-01 | Discharge: 2012-06-01 | Disposition: A | Discharge status: Home / Self Care | Payer: Medicaid Other | Attending: Emergency Medicine | Admitting: Emergency Medicine

## 2012-06-01 DIAGNOSIS — F411 Generalized anxiety disorder: Secondary | ICD-10-CM | POA: Insufficient documentation

## 2012-06-01 DIAGNOSIS — F17200 Nicotine dependence, unspecified, uncomplicated: Secondary | ICD-10-CM | POA: Insufficient documentation

## 2012-06-01 DIAGNOSIS — G8929 Other chronic pain: Secondary | ICD-10-CM | POA: Insufficient documentation

## 2012-06-01 DIAGNOSIS — M549 Dorsalgia, unspecified: Secondary | ICD-10-CM | POA: Insufficient documentation

## 2012-06-01 DIAGNOSIS — Z8489 Family history of other specified conditions: Secondary | ICD-10-CM | POA: Insufficient documentation

## 2012-06-01 MED ORDER — DIAZEPAM 5 MG PO TABS
5.0000 mg | ORAL_TABLET | Freq: Once | ORAL | Status: AC
  Administered 2012-06-01: 5 mg via ORAL
  Filled 2012-06-01: qty 1

## 2012-06-01 MED ORDER — MELOXICAM 7.5 MG PO TABS: ORAL_TABLET | ORAL | Status: DC

## 2012-06-01 MED ORDER — KETOROLAC TROMETHAMINE 10 MG PO TABS
10.0000 mg | ORAL_TABLET | Freq: Once | ORAL | Status: AC
  Administered 2012-06-01: 10 mg via ORAL
  Filled 2012-06-01: qty 1

## 2012-06-01 MED ORDER — ONDANSETRON HCL 4 MG PO TABS
4.0000 mg | ORAL_TABLET | Freq: Once | ORAL | Status: AC
  Administered 2012-06-01: 4 mg via ORAL
  Filled 2012-06-01: qty 1

## 2012-06-01 MED ORDER — DEXAMETHASONE SODIUM PHOSPHATE 4 MG/ML IJ SOLN
8.0000 mg | Freq: Once | INTRAMUSCULAR | Status: DC
  Filled 2012-06-01: qty 2

## 2012-06-01 MED ORDER — DEXAMETHASONE 4 MG PO TABS: ORAL_TABLET | ORAL | Status: AC

## 2012-06-01 MED ORDER — DIAZEPAM 5 MG PO TABS: ORAL_TABLET | ORAL | Status: DC

## 2012-06-01 NOTE — ED Provider Notes (Signed)
Medical screening examination/treatment/procedure(s) were performed by non-physician practitioner and as supervising physician I was immediately available for consultation/collaboration.   Benny Lennert, MD 06/01/12 [phone]

## 2012-06-01 NOTE — ED Provider Notes (Signed)
History     CSN: [id number]  Arrival date & time 06/01/12  4098   First MD Initiated Contact with Patient 06/01/12 [phone]      Chief Complaint  Patient presents with  . Back Pain    (Consider location/radiation/quality/duration/timing/severity/associated sxs/prior treatment) Patient is a 46 y.o. female presenting with back pain. The history is provided by the patient.  Back Pain  This is a chronic problem. The current episode started more than 1 week ago. The problem occurs daily. The pain is associated with no known injury. The pain is present in the lumbar spine. The quality of the pain is described as aching. The pain is at a severity of 8/10. The symptoms are aggravated by bending. The pain is the same all the time. Pertinent negatives include no chest pain, no abdominal pain, no bowel incontinence, no perianal numbness, no bladder incontinence, no dysuria and no paresthesias. She has tried nothing for the symptoms.    Past Medical History  Diagnosis Date  . Anxiety   . Chronic back pain     Past Surgical History  Procedure Date  . Cesarean section 2010 and 2011    [place], [place]  . Tubal ligation 2011    with c-section  . Femoral hernia repair 09/15/2011    Procedure: HERNIA REPAIR FEMORAL;  Surgeon: Fabio Bering;  Location: [facility];  Service: General;  Laterality: Right;  WITH MESH     Family History  Problem Relation Age of Onset  . Anesthesia problems Neg Hx   . Hypotension Neg Hx   . Malignant hyperthermia Neg Hx   . Pseudochol deficiency Neg Hx     History  Substance Use Topics  . Smoking status: Current Everyday Smoker -- 0.2 packs/day for 20 years    Types: Cigarettes  . Smokeless tobacco: Former Neurosurgeon  . Alcohol Use: No    OB History    Grav Para Term Preterm Abortions TAB SAB Ect Mult Living   7 4 3 1 3 2 1   4       Review of Systems  Constitutional: Negative for activity change.       All ROS Neg except as noted in HPI  HENT: Negative for  nosebleeds and neck pain.   Eyes: Negative for photophobia and discharge.  Respiratory: Negative for cough, shortness of breath and wheezing.   Cardiovascular: Negative for chest pain and palpitations.  Gastrointestinal: Negative for abdominal pain, blood in stool and bowel incontinence.  Genitourinary: Negative for bladder incontinence, dysuria, frequency and hematuria.  Musculoskeletal: Positive for back pain. Negative for arthralgias.  Skin: Negative.   Neurological: Negative for dizziness, seizures, speech difficulty and paresthesias.  Psychiatric/Behavioral: Negative for hallucinations and confusion. The patient is nervous/anxious.     Allergies  Review of patient's allergies indicates no known allergies.  Home Medications   Current Outpatient Rx  Name Route Sig Dispense Refill  . ACETAMINOPHEN 500 MG PO TABS Oral Take 1,000 mg by mouth every 6 (six) hours as needed. For pain    . BACLOFEN 10 MG PO TABS Oral Take 1 tablet (10 mg total) by mouth 3 (three) times daily. 15 each 0  . CLONAZEPAM 1 MG PO TABS Oral Take 1 mg by mouth 2 (two) times daily as needed. For pain    . OMEGA-3 FATTY ACIDS 1000 MG PO CAPS Oral Take 1 g by mouth daily.      Marland Kitchen HYDROCODONE-ACETAMINOPHEN 5-325 MG PO TABS Oral Take 1 tablet by mouth  every 4 (four) hours as needed for pain. 15 tablet 0  . IBUPROFEN 200 MG PO TABS Oral Take 600 mg by mouth as needed. For pain     . MIRTAZAPINE 30 MG PO TABS Oral Take 30 mg by mouth at bedtime.    . WOMENS MULTIVITAMIN PLUS PO Oral Take 1 tablet by mouth daily.      . TRAZODONE HCL 50 MG PO TABS Oral Take 50 mg by mouth at bedtime.      BP 100/61  Pulse 76  Temp 98.1 F (36.7 C) (Oral)  Resp 16  Ht 5\' 8"  (1.727 m)  Wt 145 lb (65.772 kg)  BMI 22.05 kg/m2  SpO2 98%  LMP 05/18/2012  Physical Exam  Nursing note and vitals reviewed. Constitutional: She is oriented to person, place, and time. She appears well-developed and well-nourished.  Non-toxic appearance.    HENT:  Head: Normocephalic.  Right Ear: Tympanic membrane and external ear normal.  Left Ear: Tympanic membrane and external ear normal.  Eyes: EOM and lids are normal. Pupils are equal, round, and reactive to light.  Neck: Normal range of motion. Neck supple. Carotid bruit is not present.  Cardiovascular: Normal rate, regular rhythm, normal heart sounds, intact distal pulses and normal pulses.   Pulmonary/Chest: Breath sounds normal. No respiratory distress.  Abdominal: Soft. Bowel sounds are normal. There is no tenderness. There is no guarding.  Musculoskeletal: Normal range of motion.       Pain with attempting ROM of the lumbar area. Paraspinal tightness and tenderness.  Lymphadenopathy:       Head (right side): No submandibular adenopathy present.       Head (left side): No submandibular adenopathy present.    She has no cervical adenopathy.  Neurological: She is alert and oriented to person, place, and time. She has normal strength. No cranial nerve deficit or sensory deficit.       Gait wnl. No gross neuro deficits.  Skin: Skin is warm and dry.  Psychiatric: She has a normal mood and affect. Her speech is normal.    ED Course  Procedures (including critical care time)  Labs Reviewed - No data to display No results found.   No diagnosis found.    MDM  I have reviewed nursing notes, vital signs, and all appropriate lab and imaging results for this patient. History of chronic back pain. She is scheduled to see the neurologist and pain management person on August 28. States she" cannot take the pain" until then. Examination is negative for acute deficits or acute changes. The plan at this time is for the patient be treated with dexamethasone daily, Mobic 2 times daily, and Valium 5 mg 3 times daily. Patient is to use heat to her back.       Kathie Dike, [place] 06/01/12 [phone]

## 2012-06-01 NOTE — ED Notes (Signed)
Patient c/o lower back pain. Per patient hx of chronic back pain x10 years. Patient states she has appointment with Dr Gerilyn Pilgrim on August 28th.

## 2012-06-28 ENCOUNTER — Ambulatory Visit (HOSPITAL_COMMUNITY): Payer: Medicaid Other | Admitting: Physical Therapy

## 2012-07-03 ENCOUNTER — Ambulatory Visit (HOSPITAL_COMMUNITY)
Admission: RE | Admit: 2012-07-03 | Discharge: 2012-07-03 | Disposition: A | Discharge status: Home / Self Care | Payer: Medicaid Other | Source: Ambulatory Visit | Attending: Neurology | Admitting: Neurology

## 2012-07-03 DIAGNOSIS — M6281 Muscle weakness (generalized): Secondary | ICD-10-CM | POA: Insufficient documentation

## 2012-07-03 DIAGNOSIS — IMO0001 Reserved for inherently not codable concepts without codable children: Secondary | ICD-10-CM | POA: Insufficient documentation

## 2012-07-03 DIAGNOSIS — M549 Dorsalgia, unspecified: Secondary | ICD-10-CM | POA: Insufficient documentation

## 2012-07-03 NOTE — Evaluation (Signed)
Physical Therapy Evaluation  Patient Details  Name: Ana Austin MRN: [id number] Date of Birth: [birth year]  Today's Date: 07/03/2012 Time: 7829-5621 PT Time Calculation (min): 39 min  Visit#: 1  of 4      Authorization: medicaid  Authorization Time Period:    Authorization Visit#: 0  of 3    Past Medical History:  Past Medical History  Diagnosis Date  . Anxiety   . Chronic back pain    Past Surgical History:  Past Surgical History  Procedure Date  . Cesarean section 2010 and 2011    [place], [place]  . Tubal ligation 2011    with c-section  . Femoral hernia repair 09/15/2011    Procedure: HERNIA REPAIR FEMORAL;  Surgeon: Fabio Bering;  Location: [facility];  Service: General;  Laterality: Right;  WITH MESH     Subjective Symptoms/Limitations Symptoms: Ana Austin states that she has been having chronic back pain   Precautions/Restrictions Additional Medical History: Ana Austin states that she has chronic back pain but the pain has been increased over the past month. The patient states that her husband recently has died of brain cancer, she has two young children and she feels that her pain is partly due to stress. She states that she has tried to be walking to decrease her pain and stress but she has only seen minimal improvement. She is now taking hydrocodone and Ibuprofen. She states mornings are worst. She has more pain on the L side. She states occasionally she has increased leg pain to her knee. The patient wants to see what exercises will benefit her. Prematurity: N/A Severity Level: N/A Treatment Goals:  1. Goal: I HEP  Baseline: not doing any exercises  Duration: 2 Week(s)  2. Goal: Pt core mm strength to be imporved by one grade.  Baseline: core mm 3+/5  Duration: 4 Week(s)  3. Goal: No pain upon return of flexion.  Baseline: Pt has increased pain when returning from flexed position  Duration: 4 Week(s)     Exercise/Treatments     Supine Ab Set: 10  reps Bridge: 10 reps Straight Leg Raise: 10 reps;Limitations Straight Leg Raises Limitations: floating Sidelying Hip Abduction: 10 reps    Physical Therapy Assessment and Plan PT Assessment and Plan Clinical Impression Statement: Pt with decreased core strength who will benefit from skilled therapy to improve core stability. Pt will benefit from skilled therapeutic intervention in order to improve on the following deficits: Decreased activity tolerance;Decreased strength;Pain Rehab Potential: Good PT Frequency: Min 2X/week PT Duration:  (2 weeks) PT Treatment/Interventions: Neuromuscular re-education;Therapeutic activities;Therapeutic exercise PT Plan: begin prone heel squeeze, SAR, SLR, opposite A/L and mini squat next treatment; 3rd begin all four SLR, opposite a/L and proper lifting, T-band ; 4th give t-band; lunging and side to side for vacuuming and mopping techniques.      Problem List There is no problem list on file for this patient.   PT - End of Session Activity Tolerance: Patient tolerated treatment well General Behavior During Session: [facility] for tasks performed PT Plan of Care PT Home Exercise Plan: given Consulted and Agree with Plan of Care: Patient  GP    Ana,Austin 07/03/2012, 4:39 PM  Physician Documentation Your signature is required to indicate approval of the treatment plan as stated above.  Please sign and either send electronically or make a copy of this report for your files and return this physician signed original.   Please mark one 1.__approve of plan  2. ___approve of  plan with the following conditions.   ______________________________                                                          _____________________ Physician Signature                                                                                                             Date

## 2013-01-26 ENCOUNTER — Emergency Department (HOSPITAL_COMMUNITY)
Admission: EM | Admit: 2013-01-26 | Discharge: 2013-01-26 | Disposition: A | Discharge status: Home / Self Care | Payer: Self-pay | Attending: Emergency Medicine | Admitting: Emergency Medicine

## 2013-01-26 ENCOUNTER — Encounter (HOSPITAL_COMMUNITY): Payer: Self-pay | Admitting: Emergency Medicine

## 2013-01-26 DIAGNOSIS — F17200 Nicotine dependence, unspecified, uncomplicated: Secondary | ICD-10-CM | POA: Insufficient documentation

## 2013-01-26 DIAGNOSIS — M549 Dorsalgia, unspecified: Secondary | ICD-10-CM | POA: Insufficient documentation

## 2013-01-26 DIAGNOSIS — F411 Generalized anxiety disorder: Secondary | ICD-10-CM | POA: Insufficient documentation

## 2013-01-26 DIAGNOSIS — F419 Anxiety disorder, unspecified: Secondary | ICD-10-CM

## 2013-01-26 DIAGNOSIS — G8929 Other chronic pain: Secondary | ICD-10-CM | POA: Insufficient documentation

## 2013-01-26 DIAGNOSIS — Z79899 Other long term (current) drug therapy: Secondary | ICD-10-CM | POA: Insufficient documentation

## 2013-01-26 MED ORDER — LORAZEPAM 1 MG PO TABS: 1.0000 mg | ORAL_TABLET | Freq: Three times a day (TID) | ORAL | Status: DC | PRN

## 2013-01-26 NOTE — ED Provider Notes (Signed)
History     CSN: [id number]  Arrival date & time 01/26/13  1320   First MD Initiated Contact with Patient 01/26/13 1348      Chief Complaint  Patient presents with  . Back Pain  . Medication Refill     Patient is a 47 y.o. female presenting with anxiety. The history is provided by the patient.  Anxiety This is a chronic problem. The current episode started more than 1 week ago. The problem occurs constantly. The problem has been gradually worsening. Pertinent negatives include no chest pain, no abdominal pain, no headaches and no shortness of breath. Nothing aggravates the symptoms. Nothing relieves the symptoms.    Past Medical History  Diagnosis Date  . Anxiety   . Chronic back pain     Past Surgical History  Procedure Laterality Date  . Cesarean section  2010 and 2011    [place], [place]  . Tubal ligation  2011    with c-section  . Femoral hernia repair  09/15/2011    Procedure: HERNIA REPAIR FEMORAL;  Surgeon: Fabio Bering;  Location: [facility];  Service: General;  Laterality: Right;  WITH MESH     Family History  Problem Relation Age of Onset  . Anesthesia problems Neg Hx   . Hypotension Neg Hx   . Malignant hyperthermia Neg Hx   . Pseudochol deficiency Neg Hx     History  Substance Use Topics  . Smoking status: Current Every Day Smoker -- 0.25 packs/day for 20 years    Types: Cigarettes  . Smokeless tobacco: Former Neurosurgeon  . Alcohol Use: No    OB History   Grav Para Term Preterm Abortions TAB SAB Ect Mult Living   7 4 3 1 3 2 1   4       Review of Systems  Constitutional: Negative for fever.  Respiratory: Negative for shortness of breath.   Cardiovascular: Negative for chest pain.  Gastrointestinal: Negative for abdominal pain.  Neurological: Negative for weakness and headaches.  Psychiatric/Behavioral: Negative for suicidal ideas, self-injury and agitation. The patient is nervous/anxious.     Allergies  Review of patient's allergies indicates no  known allergies.  Home Medications   Current Outpatient Rx  Name  Route  Sig  Dispense  Refill  . HYDROcodone-acetaminophen (NORCO) 10-325 MG per tablet   Oral   Take 1 tablet by mouth 3 (three) times daily as needed for pain.         . clonazePAM (KLONOPIN) 1 MG tablet   Oral   Take 1 mg by mouth 3 (three) times daily. For pain         . LORazepam (ATIVAN) 1 MG tablet   Oral   Take 1 tablet (1 mg total) by mouth every 8 (eight) hours as needed for anxiety.   5 tablet   0     BP 95/65  Pulse 87  Temp(Src) 98.7 F (37.1 C) (Oral)  Resp 18  SpO2 97%  Physical Exam CONSTITUTIONAL: thin appearing but in no distress HEAD: Normocephalic, small cephalohematoma to posterior scalp.  No crepitance noted EYES: EOMI/PERRL ENMT: Mucous membranes moist NECK: supple no meningeal signs CV: S1/S2 noted, no murmurs/rubs/gallops noted LUNGS: Lungs are clear to auscultation bilaterally, no apparent distress ABDOMEN: soft, nontender, no rebound or guarding NEURO: Pt is awake/alert, moves all extremitiesx4, she is ambulatory, no distress EXTREMITIES: pulses normal, full ROM SKIN: warm, color normal PSYCH: mildly anxious  ED Course  Procedures  1. Anxiety  Pt here at recommendation of the PA for pain management office.  Pt reports she has been without klonopin for 4 days (has been on for several years) She reports some increase in anxiety but denies SI.  She denies substance abuse.  She reports she feels safe at home  I spoke th PA at Dr Harriett Sine office [facility]) and she was concerned for patient as she "seemed confused" and seemed anxious earlier today.  Also pt had cephalohematoma to scalp and she was concerned she had been falling.  Pt tells me she hit her head while standing recently while moving (no loc, no significant headache and no vomiting)   The patient never expressed SI to her.  Jola Babinski also is concerned that patient is drinking ETOH but pt denies any substance abuse.   Pt reports she already gets outpatient followup for counseling.  She appears stable for d/c.  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

## 2013-01-26 NOTE — ED Notes (Addendum)
Patient brought in via EMS. Alert and oriented. Airway patent. Patient c/o chronic back pain and anxiety in which she has been out of her medication x4 days. Patient reports going to Dr Ronal Fear office and seen [place] PA to get medications refilled but he told her that she "wasn't acting" right due to increased stress and depression and that she needed to come to ER. Patient denies any suicidal ideations.

## 2013-01-29 ENCOUNTER — Encounter (HOSPITAL_COMMUNITY): Payer: Self-pay | Admitting: *Deleted

## 2013-01-29 ENCOUNTER — Emergency Department (HOSPITAL_COMMUNITY)
Admission: EM | Admit: 2013-01-29 | Discharge: 2013-01-29 | Disposition: A | Discharge status: Home / Self Care | Payer: Self-pay | Attending: Emergency Medicine | Admitting: Emergency Medicine

## 2013-01-29 ENCOUNTER — Emergency Department (HOSPITAL_COMMUNITY): Payer: Self-pay

## 2013-01-29 DIAGNOSIS — F411 Generalized anxiety disorder: Secondary | ICD-10-CM | POA: Insufficient documentation

## 2013-01-29 DIAGNOSIS — Y929 Unspecified place or not applicable: Secondary | ICD-10-CM | POA: Insufficient documentation

## 2013-01-29 DIAGNOSIS — R110 Nausea: Secondary | ICD-10-CM | POA: Insufficient documentation

## 2013-01-29 DIAGNOSIS — S0083XD Contusion of other part of head, subsequent encounter: Secondary | ICD-10-CM

## 2013-01-29 DIAGNOSIS — S0003XA Contusion of scalp, initial encounter: Secondary | ICD-10-CM | POA: Insufficient documentation

## 2013-01-29 DIAGNOSIS — IMO0002 Reserved for concepts with insufficient information to code with codable children: Secondary | ICD-10-CM | POA: Insufficient documentation

## 2013-01-29 DIAGNOSIS — F17200 Nicotine dependence, unspecified, uncomplicated: Secondary | ICD-10-CM | POA: Insufficient documentation

## 2013-01-29 DIAGNOSIS — Z79899 Other long term (current) drug therapy: Secondary | ICD-10-CM | POA: Insufficient documentation

## 2013-01-29 DIAGNOSIS — Y9389 Activity, other specified: Secondary | ICD-10-CM | POA: Insufficient documentation

## 2013-01-29 DIAGNOSIS — M549 Dorsalgia, unspecified: Secondary | ICD-10-CM | POA: Insufficient documentation

## 2013-01-29 MED ORDER — HYDROCODONE-ACETAMINOPHEN 5-325 MG PO TABS
2.0000 | ORAL_TABLET | Freq: Once | ORAL | Status: AC
  Administered 2013-01-29: 2 via ORAL
  Filled 2013-01-29: qty 2

## 2013-01-29 MED ORDER — DIAZEPAM 5 MG PO TABS
5.0000 mg | ORAL_TABLET | Freq: Once | ORAL | Status: AC
  Administered 2013-01-29: 5 mg via ORAL
  Filled 2013-01-29: qty 1

## 2013-01-29 MED ORDER — ONDANSETRON HCL 4 MG PO TABS
4.0000 mg | ORAL_TABLET | Freq: Once | ORAL | Status: AC
  Administered 2013-01-29: 4 mg via ORAL
  Filled 2013-01-29: qty 1

## 2013-01-29 MED ORDER — HYDROCODONE-ACETAMINOPHEN 7.5-325 MG PO TABS: 1.0000 | ORAL_TABLET | ORAL | Status: DC | PRN

## 2013-01-29 NOTE — ED Notes (Signed)
Patient with no complaints at this time. Respirations even and unlabored. Skin warm/dry. Discharge instructions reviewed with patient at this time. Patient given opportunity to voice concerns/ask questions. Patient discharged at this time and left Emergency Department with steady gait.   

## 2013-01-29 NOTE — ED Notes (Signed)
Lower back pain, pain in back of head. Nausea, inability to eat from the pain.  Has been on 2- 10/325 Hydrocodone 2-3 x day.

## 2013-01-29 NOTE — ED Provider Notes (Signed)
History     CSN: [id number]  Arrival date & time 01/29/13  1028   First MD Initiated Contact with Patient 01/29/13 1113      Chief Complaint  Patient presents with  . Back Pain  . Headache  . Nausea    (Consider location/radiation/quality/duration/timing/severity/associated sxs/prior treatment) Patient is a 47 y.o. female presenting with headaches. The history is provided by the patient.  Headache Pain location:  Frontal and L parietal Quality:  Sharp Severity at highest:  10/10 Onset quality:  Sudden Duration:  3 days Timing:  Intermittent Progression:  Worsening Chronicity:  New Similar to prior headaches: no   Relieved by:  Nothing Worsened by:  Nothing tried Associated symptoms: back pain and nausea   Associated symptoms: no abdominal pain, no cough, no dizziness, no neck pain, no photophobia and no seizures     Past Medical History  Diagnosis Date  . Anxiety   . Chronic back pain     Past Surgical History  Procedure Laterality Date  . Cesarean section  2010 and 2011    [place], [place]  . Tubal ligation  2011    with c-section  . Femoral hernia repair  09/15/2011    Procedure: HERNIA REPAIR FEMORAL;  Surgeon: Fabio Bering;  Location: [facility];  Service: General;  Laterality: Right;  WITH MESH     Family History  Problem Relation Age of Onset  . Anesthesia problems Neg Hx   . Hypotension Neg Hx   . Malignant hyperthermia Neg Hx   . Pseudochol deficiency Neg Hx     History  Substance Use Topics  . Smoking status: Current Every Day Smoker -- 0.25 packs/day for 20 years    Types: Cigarettes  . Smokeless tobacco: Former Neurosurgeon  . Alcohol Use: No    OB History   Grav Para Term Preterm Abortions TAB SAB Ect Mult Living   7 4 3 1 3 2 1   4       Review of Systems  Constitutional: Negative for activity change.       All ROS Neg except as noted in HPI  HENT: Negative for nosebleeds and neck pain.   Eyes: Negative for photophobia and discharge.    Respiratory: Negative for cough, shortness of breath and wheezing.   Cardiovascular: Negative for chest pain and palpitations.  Gastrointestinal: Positive for nausea. Negative for abdominal pain and blood in stool.  Genitourinary: Negative for dysuria, frequency and hematuria.  Musculoskeletal: Positive for back pain. Negative for arthralgias.  Skin: Negative.   Neurological: Positive for headaches. Negative for dizziness, seizures and speech difficulty.  Psychiatric/Behavioral: Negative for hallucinations and confusion. The patient is nervous/anxious.     Allergies  Tramadol and Trazodone and nefazodone  Home Medications   Current Outpatient Rx  Name  Route  Sig  Dispense  Refill  . clonazePAM (KLONOPIN) 1 MG tablet   Oral   Take 1 mg by mouth 3 (three) times daily. For pain         . HYDROcodone-acetaminophen (NORCO) 10-325 MG per tablet   Oral   Take 1 tablet by mouth 3 (three) times daily as needed for pain.           BP 98/71  Pulse 94  Temp(Src) 97.9 F (36.6 C) (Oral)  Resp 20  Ht 5\' 7"  (1.702 m)  Wt 117 lb (53.071 kg)  BMI 18.32 kg/m2  SpO2 97%  LMP 12/25/2012  Physical Exam  Nursing note and vitals reviewed. Constitutional:  She is oriented to person, place, and time. She appears well-developed and well-nourished.  Non-toxic appearance.  HENT:  Head: Normocephalic.    Right Ear: Tympanic membrane and external ear normal.  Left Ear: Tympanic membrane and external ear normal.  No bulging of TM. NO blood behind the TM's. Neg Battles sign.  Eyes: EOM and lids are normal. Pupils are equal, round, and reactive to light.  Neck: Normal range of motion. Neck supple. Carotid bruit is not present.  Cardiovascular: Normal rate, regular rhythm, normal heart sounds, intact distal pulses and normal pulses.   Pulmonary/Chest: Breath sounds normal. No respiratory distress.  Abdominal: Soft. Bowel sounds are normal. There is no tenderness. There is no guarding.   Musculoskeletal: Normal range of motion.  Lumbar area pain to palpation and change of position.  Left paraspinal tenderness and spasm.  Lymphadenopathy:       Head (right side): No submandibular adenopathy present.       Head (left side): No submandibular adenopathy present.    She has no cervical adenopathy.  Neurological: She is alert and oriented to person, place, and time. She has normal strength. No cranial nerve deficit or sensory deficit.  Skin: Skin is warm and dry.  Psychiatric: She has a normal mood and affect. Her speech is normal.    ED Course  Procedures (including critical care time)  Labs Reviewed - No data to display Ct Head Wo Contrast  01/29/2013  *RADIOLOGY REPORT*  Clinical Data: Hit back of head on cabinet, contusion, pain  CT HEAD WITHOUT CONTRAST  Technique:  Contiguous axial images were obtained from the base of the skull through the vertex without contrast.  Comparison: None.  Findings: The ventricular system is normal in size and configuration, and the septum is in a normal midline position.  The fourth ventricle basilar cisterns appear normal.  No hemorrhage, mass lesion, or acute infarction is seen.  On bone window images, there is a left posterior parietal scalp hematoma present.  No underlying calvarial fracture is seen.  IMPRESSION:  1.  No acute intracranial abnormality. 2.  Left post parietal scalp hematoma.   Original Report Authenticated By: Dwyane Dee, M.D.      No diagnosis found.    MDM  I have reviewed nursing notes, vital signs, and all appropriate lab and imaging results for this patient. Pt his head on a cabnet, fell and injured the chronic lower problem. CT scan of the head is negative. Pt is ambulaory. No gross neuro changes noted. Pt given Rx for norco 7.5mg  # 15 tab. Pt to see PCP for follow up and recheck.       Kathie Dike, PA-C 02/01/13 1451

## 2013-02-01 NOTE — ED Provider Notes (Signed)
Medical screening examination/treatment/procedure(s) were performed by non-physician practitioner and as supervising physician I was immediately available for consultation/collaboration.   Joya Gaskins, MD 02/01/13 [phone]

## 2013-04-17 ENCOUNTER — Encounter (HOSPITAL_COMMUNITY): Payer: Self-pay | Admitting: *Deleted

## 2013-04-17 DIAGNOSIS — F17200 Nicotine dependence, unspecified, uncomplicated: Secondary | ICD-10-CM | POA: Insufficient documentation

## 2013-04-17 DIAGNOSIS — Z3202 Encounter for pregnancy test, result negative: Secondary | ICD-10-CM | POA: Insufficient documentation

## 2013-04-17 DIAGNOSIS — R109 Unspecified abdominal pain: Secondary | ICD-10-CM | POA: Insufficient documentation

## 2013-04-17 DIAGNOSIS — F411 Generalized anxiety disorder: Secondary | ICD-10-CM | POA: Insufficient documentation

## 2013-04-17 DIAGNOSIS — Z79899 Other long term (current) drug therapy: Secondary | ICD-10-CM | POA: Insufficient documentation

## 2013-04-17 NOTE — ED Notes (Signed)
Pt reporting pain in abdomen after feeling a popping sensation when doing a pushup.  Reports previously having a hernia and is concerned she may have another.  No relief following ibuprofen.

## 2013-04-18 ENCOUNTER — Emergency Department (HOSPITAL_COMMUNITY)
Admission: EM | Admit: 2013-04-18 | Discharge: 2013-04-18 | Disposition: A | Discharge status: Home / Self Care | Payer: Self-pay | Attending: Emergency Medicine | Admitting: Emergency Medicine

## 2013-04-18 DIAGNOSIS — R109 Unspecified abdominal pain: Secondary | ICD-10-CM

## 2013-04-18 LAB — CBC WITH DIFFERENTIAL/PLATELET
Basophils Relative: 0 % (ref 0–1)
HCT: 37.5 % (ref 36.0–46.0)
Hemoglobin: 12.8 g/dL (ref 12.0–15.0)
MCHC: 34.1 g/dL (ref 30.0–36.0)
Monocytes Absolute: 0.7 10*3/uL (ref 0.1–1.0)
Monocytes Relative: 6 % (ref 3–12)
Neutro Abs: 5.3 10*3/uL (ref 1.7–7.7)

## 2013-04-18 LAB — URINALYSIS, ROUTINE W REFLEX MICROSCOPIC
Hgb urine dipstick: NEGATIVE
Leukocytes, UA: NEGATIVE
Protein, ur: NEGATIVE mg/dL
Urobilinogen, UA: 0.2 mg/dL (ref 0.0–1.0)

## 2013-04-18 MED ORDER — HYDROMORPHONE HCL PF 2 MG/ML IJ SOLN
2.0000 mg | Freq: Once | INTRAMUSCULAR | Status: AC
  Administered 2013-04-18: 2 mg via INTRAMUSCULAR
  Filled 2013-04-18: qty 1

## 2013-04-18 NOTE — ED Notes (Signed)
States she thinks she can tolerate examine now - MD informed.

## 2013-04-18 NOTE — ED Provider Notes (Signed)
History    CSN: [id number] Arrival date & time 04/17/13  2100  First MD Initiated Contact with Patient 04/18/13 0021     Chief Complaint  Patient presents with  . Abdominal Pain   Patient is a 47 y.o. female presenting with abdominal pain. The history is provided by the patient.  Abdominal Pain This is a new problem. Episode onset: just prior to arrival. The problem occurs constantly. The problem has been gradually worsening. Associated symptoms include abdominal pain. Pertinent negatives include no chest pain. Exacerbated by: palpation and movement. The symptoms are relieved by rest. She has tried rest for the symptoms. The treatment provided no relief.  pt reports she was doing a pushup for exercise when she felt a "pop" in her abdomen and thinks it is a hernia She reports h/o femoral hernia in the past that required repair She has otherwise been feeling well prior to this episodes No fever/vomiting/diarrhea.  No new back pain No weakness reported  While waiting in the waiting room she went out to smoke several times  Past Medical History  Diagnosis Date  . Anxiety   . Chronic back pain    Past Surgical History  Procedure Laterality Date  . Cesarean section  2010 and 2011    [place], [place]  . Tubal ligation  2011    with c-section  . Femoral hernia repair  09/15/2011    Procedure: HERNIA REPAIR FEMORAL;  Surgeon: Fabio Bering;  Location: [facility];  Service: General;  Laterality: Right;  WITH MESH    Family History  Problem Relation Age of Onset  . Anesthesia problems Neg Hx   . Hypotension Neg Hx   . Malignant hyperthermia Neg Hx   . Pseudochol deficiency Neg Hx    History  Substance Use Topics  . Smoking status: Current Every Day Smoker -- 0.25 packs/day for 20 years    Types: Cigarettes  . Smokeless tobacco: Former Neurosurgeon  . Alcohol Use: No   OB History   Grav Para Term Preterm Abortions TAB SAB Ect Mult Living   7 4 3 1 3 2 1   4      Review of Systems   Constitutional: Negative for fever.  Cardiovascular: Negative for chest pain.  Gastrointestinal: Positive for abdominal pain. Negative for constipation.  All other systems reviewed and are negative.    Allergies  Tramadol and Trazodone and nefazodone  Home Medications   Current Outpatient Rx  Name  Route  Sig  Dispense  Refill  . clonazePAM (KLONOPIN) 1 MG tablet   Oral   Take 1 mg by mouth 3 (three) times daily. For pain         . HYDROcodone-acetaminophen (NORCO) 10-325 MG per tablet   Oral   Take 1 tablet by mouth 3 (three) times daily as needed for pain.         Marland Kitchen HYDROcodone-acetaminophen (NORCO) 7.5-325 MG per tablet   Oral   Take 1 tablet by mouth every 4 (four) hours as needed for pain.   15 tablet   0    BP 98/59  Pulse 69  Temp(Src) 98.6 F (37 C) (Oral)  Resp 18  Ht 5\' 8"  (1.727 m)  Wt 125 lb (56.7 kg)  BMI 19.01 kg/m2  SpO2 100%  LMP 04/07/2013 Physical Exam CONSTITUTIONAL: thin appearing and anxious HEAD: Normocephalic/atraumatic EYES: EOMI/PERRL ENMT: Mucous membranes moist NECK: supple no meningeal signs CV: S1/S2 noted, no murmurs/rubs/gallops noted LUNGS: Lungs are clear to auscultation  bilaterally, no apparent distress ABDOMEN: soft, no rebound/guarding.  ?small reducible abdominal wall hernia just below the umbilicus without overlying erythema/warmth and minimal tenderness.  No other hernia or abdominal wall defect noted No pulsatile mass noted GU:no cva tenderness NEURO: Pt is awake/alert, moves all extremitiesx4 EXTREMITIES: pulses normal, full ROM SKIN: warm, color normal PSYCH: anxious  ED Course  Procedures  Labs Reviewed  CBC WITH DIFFERENTIAL - Abnormal; Notable for the following:    WBC 11.7 (*)    Lymphocytes Relative 47 (*)    Lymphs Abs 5.5 (*)    All other components within normal limits  URINALYSIS, ROUTINE W REFLEX MICROSCOPIC  POCT PREGNANCY, URINE   No results found. 1. Abdominal pain     Pt with h/o  femoral hernia in the past that required repair Initially she was very anxious and reported pain and so exam difficult After pain meds, she was much more calm and allowed thorough exam.  No signs of incarcerated hernia.  Her abdominal exam is benign Discussed need to limit activity and to avoid activities that could provoke hernia (straining/lifting/smoking cigarettes) Pt stable for d/c She has ride home  MDM  Nursing notes including past medical history and social history reviewed and considered in documentation Labs/vital reviewed and considered   Joya Gaskins, MD 04/18/13 [phone]

## 2014-02-20 ENCOUNTER — Emergency Department (HOSPITAL_COMMUNITY): Payer: Medicaid Other

## 2014-02-20 ENCOUNTER — Encounter (HOSPITAL_COMMUNITY): Payer: Self-pay | Admitting: Emergency Medicine

## 2014-02-20 ENCOUNTER — Emergency Department (HOSPITAL_COMMUNITY)
Admission: EM | Admit: 2014-02-20 | Discharge: 2014-02-20 | Disposition: A | Discharge status: Home / Self Care | Payer: Medicaid Other | Attending: Emergency Medicine | Admitting: Emergency Medicine

## 2014-02-20 DIAGNOSIS — R6883 Chills (without fever): Secondary | ICD-10-CM | POA: Insufficient documentation

## 2014-02-20 DIAGNOSIS — R059 Cough, unspecified: Secondary | ICD-10-CM | POA: Insufficient documentation

## 2014-02-20 DIAGNOSIS — Z8659 Personal history of other mental and behavioral disorders: Secondary | ICD-10-CM | POA: Insufficient documentation

## 2014-02-20 DIAGNOSIS — G8929 Other chronic pain: Secondary | ICD-10-CM | POA: Insufficient documentation

## 2014-02-20 DIAGNOSIS — F17200 Nicotine dependence, unspecified, uncomplicated: Secondary | ICD-10-CM | POA: Insufficient documentation

## 2014-02-20 DIAGNOSIS — R05 Cough: Secondary | ICD-10-CM

## 2014-02-20 DIAGNOSIS — L539 Erythematous condition, unspecified: Secondary | ICD-10-CM | POA: Insufficient documentation

## 2014-02-20 MED ORDER — BENZONATATE 100 MG PO CAPS: 100.0000 mg | ORAL_CAPSULE | Freq: Three times a day (TID) | ORAL | Status: DC

## 2014-02-20 NOTE — ED Provider Notes (Signed)
CSN: [id number]     Arrival date & time 02/20/14  1103 History   First MD Initiated Contact with Patient 02/20/14 1116     Chief Complaint  Patient presents with  . Cough     (Consider location/radiation/quality/duration/timing/severity/associated sxs/prior Treatment) Patient is a 48 y.o. female presenting with cough. The history is provided by the patient. No language interpreter was used.  Cough Cough characteristics:  Productive Sputum characteristics:  Nondescript Severity:  Severe Onset quality:  Gradual Duration:  1 week Timing:  Constant Progression:  Unchanged Chronicity:  New Smoker: yes   Relieved by:  Nothing Ineffective treatments: mucinex. Associated symptoms: chills   Associated symptoms: no fever     Past Medical History  Diagnosis Date  . Anxiety   . Chronic back pain    Past Surgical History  Procedure Laterality Date  . Cesarean section  2010 and 2011    [place], [place]  . Tubal ligation  2011    with c-section  . Femoral hernia repair  09/15/2011    Procedure: HERNIA REPAIR FEMORAL;  Surgeon: Donato Heinz;  Location: [facility];  Service: General;  Laterality: Right;  WITH MESH    Family History  Problem Relation Age of Onset  . Anesthesia problems Neg Hx   . Hypotension Neg Hx   . Malignant hyperthermia Neg Hx   . Pseudochol deficiency Neg Hx    History  Substance Use Topics  . Smoking status: Current Every Day Smoker -- 0.25 packs/day for 20 years    Types: Cigarettes  . Smokeless tobacco: Former Systems developer  . Alcohol Use: No   OB History   Grav Para Term Preterm Abortions TAB SAB Ect Mult Living   7 4 3 1 3 2 1   4      Review of Systems  Constitutional: Positive for chills. Negative for fever.  Respiratory: Positive for cough.   Cardiovascular: Negative.       Allergies  Tramadol and Trazodone and nefazodone  Home Medications   Prior to Admission medications   Not on File   BP 91/64  Pulse 77  Temp(Src) 98.4 F (36.9 C) (Oral)   Resp 20  SpO2 95%  LMP 02/06/2014 Physical Exam  Vitals reviewed. Constitutional: She is oriented to person, place, and time. She appears well-developed and well-nourished.  HENT:  Right Ear: External ear normal.  Left Ear: External ear normal.  Mouth/Throat: Posterior oropharyngeal erythema present.  Cardiovascular: Normal rate and regular rhythm.   Pulmonary/Chest: Effort normal and breath sounds normal.  Musculoskeletal: Normal range of motion.  Neurological: She is alert and oriented to person, place, and time.    ED Course  Procedures (including critical care time) Labs Review Labs Reviewed - No data to display  Imaging Review Dg Chest 2 View  02/20/2014   CLINICAL DATA:  Cough  EXAM: CHEST  2 VIEW  COMPARISON:  None.  FINDINGS: The heart size and mediastinal contours are within normal limits. Both lungs are clear. The visualized skeletal structures are unremarkable.  IMPRESSION: No active cardiopulmonary disease.   Electronically Signed   By: Franchot Gallo M.D.   On: 02/20/2014 12:16     EKG Interpretation None      MDM   Final diagnoses:  Cough    No infection noted. Will treat symptomatically    Glendell Docker, NP 02/20/14 1302

## 2014-02-20 NOTE — ED Notes (Signed)
Pt c/o cough x 1 week.

## 2014-02-20 NOTE — Discharge Instructions (Signed)
Cough, Adult  A cough is a reflex. It helps you clear your throat and airways. A cough can help heal your body. A cough can last 2 or 3 weeks (acute) or may last more than 8 weeks (chronic). Some common causes of a cough can include an infection, allergy, or a cold. HOME CARE  Only take medicine as told by your doctor.  If given, take your medicines (antibiotics) as told. Finish them even if you start to feel better.  Use a cold steam vaporizer or humidier in your home. This can help loosen thick spit (secretions).  Sleep so you are almost sitting up (semi-upright). Use pillows to do this. This helps reduce coughing.  Rest as needed.  Stop smoking if you smoke. GET HELP RIGHT AWAY IF:  You have yellowish-white fluid (pus) in your thick spit.  Your cough gets worse.  Your medicine does not reduce coughing, and you are losing sleep.  You cough up blood.  You have trouble breathing.  Your pain gets worse and medicine does not help.  You have a fever. MAKE SURE YOU:   Understand these instructions.  Will watch your condition.  Will get help right away if you are not doing well or get worse. Document Released: 06/17/2011 Document Revised: 12/27/2011 Document Reviewed: 06/17/2011 [employer] Patient Information 2014 [employer].  

## 2014-02-21 NOTE — ED Provider Notes (Signed)
Medical screening examination/treatment/procedure(s) were performed by non-physician practitioner and as supervising physician I was immediately available for consultation/collaboration.   EKG Interpretation None       Nat Christen, MD 02/21/14 1530

## 2014-08-19 ENCOUNTER — Encounter (HOSPITAL_COMMUNITY): Payer: Self-pay | Admitting: Emergency Medicine

## 2015-04-05 ENCOUNTER — Emergency Department (HOSPITAL_COMMUNITY)
Admission: EM | Admit: 2015-04-05 | Discharge: 2015-04-05 | Disposition: A | Discharge status: Home / Self Care | Payer: Medicaid Other | Attending: Emergency Medicine | Admitting: Emergency Medicine

## 2015-04-05 ENCOUNTER — Emergency Department (HOSPITAL_COMMUNITY): Payer: Medicaid Other

## 2015-04-05 ENCOUNTER — Encounter (HOSPITAL_COMMUNITY): Payer: Self-pay | Admitting: Emergency Medicine

## 2015-04-05 DIAGNOSIS — S233XXA Sprain of ligaments of thoracic spine, initial encounter: Secondary | ICD-10-CM

## 2015-04-05 DIAGNOSIS — Y9389 Activity, other specified: Secondary | ICD-10-CM | POA: Insufficient documentation

## 2015-04-05 DIAGNOSIS — S239XXA Sprain of unspecified parts of thorax, initial encounter: Secondary | ICD-10-CM | POA: Insufficient documentation

## 2015-04-05 DIAGNOSIS — S161XXA Strain of muscle, fascia and tendon at neck level, initial encounter: Secondary | ICD-10-CM | POA: Insufficient documentation

## 2015-04-05 DIAGNOSIS — Z8659 Personal history of other mental and behavioral disorders: Secondary | ICD-10-CM | POA: Insufficient documentation

## 2015-04-05 DIAGNOSIS — Z720 Tobacco use: Secondary | ICD-10-CM | POA: Insufficient documentation

## 2015-04-05 DIAGNOSIS — Y998 Other external cause status: Secondary | ICD-10-CM | POA: Insufficient documentation

## 2015-04-05 DIAGNOSIS — Y92410 Unspecified street and highway as the place of occurrence of the external cause: Secondary | ICD-10-CM | POA: Insufficient documentation

## 2015-04-05 DIAGNOSIS — Z79899 Other long term (current) drug therapy: Secondary | ICD-10-CM | POA: Insufficient documentation

## 2015-04-05 DIAGNOSIS — G8929 Other chronic pain: Secondary | ICD-10-CM | POA: Insufficient documentation

## 2015-04-05 DIAGNOSIS — S29012A Strain of muscle and tendon of back wall of thorax, initial encounter: Secondary | ICD-10-CM | POA: Insufficient documentation

## 2015-04-05 MED ORDER — CYCLOBENZAPRINE HCL 5 MG PO TABS: 5.0000 mg | ORAL_TABLET | Freq: Three times a day (TID) | ORAL | Status: DC | PRN

## 2015-04-05 MED ORDER — HYDROCODONE-ACETAMINOPHEN 5-325 MG PO TABS: 1.0000 | ORAL_TABLET | ORAL | Status: DC | PRN

## 2015-04-05 MED ORDER — HYDROCODONE-ACETAMINOPHEN 5-325 MG PO TABS
1.0000 | ORAL_TABLET | Freq: Once | ORAL | Status: AC
  Administered 2015-04-05: 1 via ORAL
  Filled 2015-04-05: qty 1

## 2015-04-05 MED ORDER — NAPROXEN 500 MG PO TABS: 500.0000 mg | ORAL_TABLET | Freq: Two times a day (BID) | ORAL | Status: DC
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
## 2015-04-05 NOTE — ED Notes (Signed)
Xray tech called this nurse to inform nurse of pt taking her c-collar off without instruction to do so
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
## 2023-12-21 ENCOUNTER — Inpatient Hospital Stay (HOSPITAL_COMMUNITY)
Admission: EM | Admit: 2023-12-21 | Discharge: 2023-12-23 | DRG: 193 | Disposition: A | Discharge status: Home / Self Care | Attending: Internal Medicine | Admitting: Internal Medicine

## 2023-12-21 ENCOUNTER — Encounter (HOSPITAL_COMMUNITY): Payer: Self-pay

## 2023-12-21 ENCOUNTER — Inpatient Hospital Stay (HOSPITAL_COMMUNITY)

## 2023-12-21 ENCOUNTER — Other Ambulatory Visit: Payer: Self-pay

## 2023-12-21 ENCOUNTER — Emergency Department (HOSPITAL_COMMUNITY)

## 2023-12-21 DIAGNOSIS — F419 Anxiety disorder, unspecified: Secondary | ICD-10-CM | POA: Diagnosis present

## 2023-12-21 DIAGNOSIS — J101 Influenza due to other identified influenza virus with other respiratory manifestations: Secondary | ICD-10-CM | POA: Diagnosis present

## 2023-12-21 DIAGNOSIS — J441 Chronic obstructive pulmonary disease with (acute) exacerbation: Secondary | ICD-10-CM | POA: Diagnosis present

## 2023-12-21 DIAGNOSIS — G8929 Other chronic pain: Secondary | ICD-10-CM | POA: Diagnosis present

## 2023-12-21 DIAGNOSIS — F17210 Nicotine dependence, cigarettes, uncomplicated: Secondary | ICD-10-CM | POA: Diagnosis present

## 2023-12-21 DIAGNOSIS — Z79899 Other long term (current) drug therapy: Secondary | ICD-10-CM | POA: Diagnosis not present

## 2023-12-21 DIAGNOSIS — R112 Nausea with vomiting, unspecified: Secondary | ICD-10-CM | POA: Diagnosis present

## 2023-12-21 DIAGNOSIS — E876 Hypokalemia: Secondary | ICD-10-CM | POA: Diagnosis present

## 2023-12-21 DIAGNOSIS — M549 Dorsalgia, unspecified: Secondary | ICD-10-CM | POA: Diagnosis present

## 2023-12-21 DIAGNOSIS — Z1152 Encounter for screening for COVID-19: Secondary | ICD-10-CM | POA: Diagnosis not present

## 2023-12-21 DIAGNOSIS — J9601 Acute respiratory failure with hypoxia: Principal | ICD-10-CM | POA: Diagnosis present

## 2023-12-21 DIAGNOSIS — Z885 Allergy status to narcotic agent status: Secondary | ICD-10-CM | POA: Diagnosis not present

## 2023-12-21 LAB — CBC WITH DIFFERENTIAL/PLATELET
Abs Immature Granulocytes: 0.02 10*3/uL (ref 0.00–0.07)
Basophils Absolute: 0 10*3/uL (ref 0.0–0.1)
Basophils Relative: 0 %
Eosinophils Absolute: 0 10*3/uL (ref 0.0–0.5)
Eosinophils Relative: 0 %
HCT: 43 % (ref 36.0–46.0)
Hemoglobin: 14.7 g/dL (ref 12.0–15.0)
Immature Granulocytes: 0 %
Lymphocytes Relative: 8 %
Lymphs Abs: 0.5 10*3/uL — ABNORMAL LOW (ref 0.7–4.0)
MCH: 31.3 pg (ref 26.0–34.0)
MCHC: 34.2 g/dL (ref 30.0–36.0)
MCV: 91.5 fL (ref 80.0–100.0)
Monocytes Absolute: 1.1 10*3/uL — ABNORMAL HIGH (ref 0.1–1.0)
Monocytes Relative: 16 %
Neutro Abs: 5.2 10*3/uL (ref 1.7–7.7)
Neutrophils Relative %: 76 %
Platelets: 169 10*3/uL (ref 150–400)
RBC: 4.7 MIL/uL (ref 3.87–5.11)
RDW: 13.4 % (ref 11.5–15.5)
WBC: 6.9 10*3/uL (ref 4.0–10.5)
nRBC: 0 % (ref 0.0–0.2)

## 2023-12-21 LAB — RESP PANEL BY RT-PCR (RSV, FLU A&B, COVID)  RVPGX2
Influenza A by PCR: POSITIVE — AB
Influenza B by PCR: NEGATIVE
Resp Syncytial Virus by PCR: NEGATIVE
SARS Coronavirus 2 by RT PCR: NEGATIVE

## 2023-12-21 LAB — COMPREHENSIVE METABOLIC PANEL
ALT: 14 U/L (ref 0–44)
AST: 23 U/L (ref 15–41)
Albumin: 4.5 g/dL (ref 3.5–5.0)
Alkaline Phosphatase: 75 U/L (ref 38–126)
Anion gap: 14 (ref 5–15)
BUN: 18 mg/dL (ref 6–20)
CO2: 29 mmol/L (ref 22–32)
Calcium: 9.6 mg/dL (ref 8.9–10.3)
Chloride: 95 mmol/L — ABNORMAL LOW (ref 98–111)
Creatinine, Ser: 0.73 mg/dL (ref 0.44–1.00)
GFR, Estimated: >60 mL/min (ref >60)
Glucose, Bld: 111 mg/dL — ABNORMAL HIGH (ref 70–99)
Potassium: 3.2 mmol/L — ABNORMAL LOW (ref 3.5–5.1)
Sodium: 138 mmol/L (ref 135–145)
Total Bilirubin: 0.6 mg/dL (ref 0.0–1.2)
Total Protein: 8.1 g/dL (ref 6.5–8.1)

## 2023-12-21 MED ORDER — ALBUTEROL SULFATE (2.5 MG/3ML) 0.083% IN NEBU
2.5000 mg | INHALATION_SOLUTION | Freq: Once | RESPIRATORY_TRACT | Status: AC
  Administered 2023-12-21: 2.5 mg via RESPIRATORY_TRACT
  Filled 2023-12-21: qty 3

## 2023-12-21 MED ORDER — METHYLPREDNISOLONE SODIUM SUCC 125 MG IJ SOLR
125.0000 mg | Freq: Once | INTRAMUSCULAR | Status: AC
  Administered 2023-12-21: 125 mg via INTRAVENOUS
  Filled 2023-12-21: qty 2

## 2023-12-21 MED ORDER — ONDANSETRON HCL 4 MG PO TABS: 4.0000 mg | ORAL_TABLET | Freq: Four times a day (QID) | ORAL | Status: DC | PRN

## 2023-12-21 MED ORDER — ENOXAPARIN SODIUM 40 MG/0.4ML IJ SOSY
40.0000 mg | PREFILLED_SYRINGE | INTRAMUSCULAR | Status: DC
  Administered 2023-12-22: 40 mg via SUBCUTANEOUS
  Filled 2023-12-21: qty 0.4

## 2023-12-21 MED ORDER — OXYCODONE HCL 5 MG PO TABS
5.0000 mg | ORAL_TABLET | ORAL | Status: DC | PRN
  Administered 2023-12-21 – 2023-12-22 (×3): 5 mg via ORAL
  Filled 2023-12-21 (×3): qty 1

## 2023-12-21 MED ORDER — IPRATROPIUM-ALBUTEROL 0.5-2.5 (3) MG/3ML IN SOLN
3.0000 mL | Freq: Four times a day (QID) | RESPIRATORY_TRACT | Status: DC | PRN
  Administered 2023-12-23: 3 mL via RESPIRATORY_TRACT
  Filled 2023-12-21: qty 3

## 2023-12-21 MED ORDER — OSELTAMIVIR PHOSPHATE 75 MG PO CAPS
75.0000 mg | ORAL_CAPSULE | Freq: Once | ORAL | Status: AC
  Administered 2023-12-21: 75 mg via ORAL
  Filled 2023-12-21: qty 1

## 2023-12-21 MED ORDER — LACTATED RINGERS IV BOLUS
1000.0000 mL | Freq: Once | INTRAVENOUS | Status: AC
  Administered 2023-12-21: 1000 mL via INTRAVENOUS

## 2023-12-21 MED ORDER — GUAIFENESIN-DM 100-10 MG/5ML PO SYRP
5.0000 mL | ORAL_SOLUTION | ORAL | Status: DC | PRN
  Administered 2023-12-21: 5 mL via ORAL
  Filled 2023-12-21 (×2): qty 5

## 2023-12-21 MED ORDER — POTASSIUM CHLORIDE CRYS ER 20 MEQ PO TBCR
40.0000 meq | EXTENDED_RELEASE_TABLET | Freq: Once | ORAL | Status: AC
  Administered 2023-12-21: 40 meq via ORAL
  Filled 2023-12-21: qty 2

## 2023-12-21 MED ORDER — ONDANSETRON HCL 4 MG/2ML IJ SOLN
4.0000 mg | Freq: Four times a day (QID) | INTRAMUSCULAR | Status: DC | PRN
  Administered 2023-12-21 – 2023-12-22 (×2): 4 mg via INTRAVENOUS
  Filled 2023-12-21 (×2): qty 2

## 2023-12-21 MED ORDER — PREDNISONE 20 MG PO TABS
40.0000 mg | ORAL_TABLET | Freq: Every day | ORAL | Status: DC
  Administered 2023-12-22 – 2023-12-23 (×2): 40 mg via ORAL
  Filled 2023-12-21 (×2): qty 2

## 2023-12-21 MED ORDER — ACETAMINOPHEN 650 MG RE SUPP: 650.0000 mg | Freq: Four times a day (QID) | RECTAL | Status: DC | PRN

## 2023-12-21 MED ORDER — IPRATROPIUM-ALBUTEROL 0.5-2.5 (3) MG/3ML IN SOLN
3.0000 mL | Freq: Once | RESPIRATORY_TRACT | Status: AC
  Administered 2023-12-21: 3 mL via RESPIRATORY_TRACT
  Filled 2023-12-21: qty 3

## 2023-12-21 MED ORDER — FENTANYL CITRATE PF 50 MCG/ML IJ SOSY
50.0000 ug | PREFILLED_SYRINGE | Freq: Once | INTRAMUSCULAR | Status: AC
  Administered 2023-12-21: 50 ug via INTRAVENOUS
  Filled 2023-12-21: qty 1

## 2023-12-21 MED ORDER — ACETAMINOPHEN 325 MG PO TABS: 650.0000 mg | ORAL_TABLET | Freq: Four times a day (QID) | ORAL | Status: DC | PRN

## 2023-12-21 MED ORDER — OSELTAMIVIR PHOSPHATE 75 MG PO CAPS
75.0000 mg | ORAL_CAPSULE | Freq: Two times a day (BID) | ORAL | Status: DC
  Administered 2023-12-22 – 2023-12-23 (×3): 75 mg via ORAL
  Filled 2023-12-21 (×3): qty 1

## 2023-12-21 NOTE — ED Provider Notes (Signed)
 [place] EMERGENCY DEPARTMENT AT [facility] Provider Note   CSN: [id number] Arrival date & time: 12/21/23  1056     History  Chief Complaint  Patient presents with   Cough    Ana Austin is a 58 y.o. female.  HPI 58 year old female presents with flulike symptoms for 2 days.  Starting 2 days ago she has developed cough, left-sided chest wall pain with coughing, shortness of breath, diarrhea and vomiting.  The diarrhea has stopped but she was vomiting last night.  Cough is occasionally productive.  She has a current smoking history and has used breathing treatments and inhaler in the past but denies any known history of COPD, emphysema, etc.  She does tell me that her blood pressure chronically runs low.  States her chest pain hurts in the left lateral chest and is where she broke her rib back in May and it feels like the same pain.  Home Medications Prior to Admission medications   Medication Sig Start Date End Date Taking? Authorizing Provider  acetaminophen (TYLENOL) 500 MG tablet Take 1,000 mg by mouth every 6 (six) hours as needed for moderate pain (pain score 4-6).   Yes [provider]  Multiple Vitamin (MULTIVITAMIN) tablet Take 1 tablet by mouth daily.   Yes [provider]  Multiple Vitamins-Minerals (ALIVE HAIR, SKIN & NAILS PO) Take 2 tablets by mouth daily.   Yes [provider]      Allergies    Tramadol and Trazodone and nefazodone    Review of Systems   Review of Systems  Constitutional:  Negative for fever.  HENT:  Negative for sore throat.   Respiratory:  Positive for cough and shortness of breath.   Cardiovascular:  Positive for chest pain.  Gastrointestinal:  Positive for diarrhea, nausea and vomiting. Negative for abdominal pain.    Physical Exam Updated Vital Signs BP 104/86   Pulse 76   Temp 98.9 F (37.2 C) (Oral)   Resp 20   Ht 5\' 8"  (1.727 m)   Wt 54.5 kg   LMP 04/03/2015   SpO2 94%   BMI 18.27  kg/m  Physical Exam Vitals and nursing note reviewed.  Constitutional:      Appearance: She is well-developed and underweight. She is not diaphoretic.  HENT:     Head: Normocephalic and atraumatic.  Cardiovascular:     Rate and Rhythm: Normal rate and regular rhythm.     Heart sounds: Normal heart sounds.  Pulmonary:     Effort: Pulmonary effort is normal.     Breath sounds: Wheezing present.  Chest:     Chest wall: Tenderness (left mid-axillary line) present.  Abdominal:     Palpations: Abdomen is soft.     Tenderness: There is no abdominal tenderness.  Skin:    General: Skin is warm and dry.  Neurological:     Mental Status: She is alert.     ED Results / Procedures / Treatments   Labs (all labs ordered are listed, but only abnormal results are displayed) Labs Reviewed  RESP PANEL BY RT-PCR (RSV, FLU A&B, COVID)  RVPGX2 - Abnormal; Notable for the following components:      Result Value   Influenza A by PCR POSITIVE (*)    All other components within normal limits  CBC WITH DIFFERENTIAL/PLATELET - Abnormal; Notable for the following components:   Lymphs Abs 0.5 (*)    Monocytes Absolute 1.1 (*)    All other components within normal  limits  COMPREHENSIVE METABOLIC PANEL - Abnormal; Notable for the following components:   Potassium 3.2 (*)    Chloride 95 (*)    Glucose, Bld 111 (*)    All other components within normal limits  HIV ANTIBODY (ROUTINE TESTING W REFLEX)    EKG None  Radiology DG Chest 2 View Result Date: 12/21/2023 CLINICAL DATA:  Shortness of breath, cough and emesis EXAM: CHEST - 2 VIEW COMPARISON:  02/20/2014 FINDINGS: Similar hyperinflation with mild streaky bibasilar densities favored to be atelectasis or scarring. No focal pneumonia, collapse or consolidation. Negative for edema, effusion or pneumothorax. Trachea midline. Normal heart size and vascularity. Minor lower thoracic endplate osteophytes noted. IMPRESSION: Hyperinflation with mild  bibasilar atelectasis or scarring. Electronically Signed   By: Judie Petit.  Shick M.D.   On: 12/21/2023 13:47    Procedures Procedures    Medications Ordered in ED Medications  enoxaparin (LOVENOX) injection 40 mg (has no administration in time range)  oxyCODONE (Oxy IR/ROXICODONE) immediate release tablet 5 mg (has no administration in time range)  acetaminophen (TYLENOL) tablet 650 mg (has no administration in time range)    Or  acetaminophen (TYLENOL) suppository 650 mg (has no administration in time range)  ondansetron (ZOFRAN) tablet 4 mg (has no administration in time range)    Or  ondansetron (ZOFRAN) injection 4 mg (has no administration in time range)  oseltamivir (TAMIFLU) capsule 75 mg (has no administration in time range)  predniSONE (DELTASONE) tablet 40 mg (has no administration in time range)  ipratropium-albuterol (DUONEB) 0.5-2.5 (3) MG/3ML nebulizer solution 3 mL (has no administration in time range)  ipratropium-albuterol (DUONEB) 0.5-2.5 (3) MG/3ML nebulizer solution 3 mL (3 mLs Nebulization Given 12/21/23 1208)  fentaNYL (SUBLIMAZE) injection 50 mcg (50 mcg Intravenous Given 12/21/23 1208)  lactated ringers bolus 1,000 mL (1,000 mLs Intravenous Bolus 12/21/23 1209)  methylPREDNISolone sodium succinate (SOLU-MEDROL) 125 mg/2 mL injection 125 mg (125 mg Intravenous Given 12/21/23 1208)  potassium chloride SA (KLOR-CON M) CR tablet 40 mEq (40 mEq Oral Given 12/21/23 1344)  ipratropium-albuterol (DUONEB) 0.5-2.5 (3) MG/3ML nebulizer solution 3 mL (3 mLs Nebulization Given 12/21/23 1405)  albuterol (PROVENTIL) (2.5 MG/3ML) 0.083% nebulizer solution 2.5 mg (2.5 mg Nebulization Given 12/21/23 1405)  oseltamivir (TAMIFLU) capsule 75 mg (75 mg Oral Given 12/21/23 1447)    ED Course/ Medical Decision Making/ A&P                                 Medical Decision Making Amount and/or Complexity of Data Reviewed Labs: ordered.    Details: Flu a Radiology: ordered and independent interpretation  performed.    Details: No pneumonia ECG/medicine tests: ordered and independent interpretation performed.    Details: No ischemia  Risk Prescription drug management. Decision regarding hospitalization.   Patient is found to be flu a positive and hypoxic requiring multiple liters of oxygen.  I have given her some breathing treatments and she seems to be opening up more but still requiring oxygen and Austin need admission.  Doubt superimposed pneumonia.  Discussed with Dr. Sherryll Burger.        Final Clinical Impression(s) / ED Diagnoses Final diagnoses:  Acute respiratory failure with hypoxia (HCC)  Influenza A    Rx / DC Orders ED Discharge Orders     None         Pricilla Loveless, MD 12/21/23 [phone]

## 2023-12-21 NOTE — ED Triage Notes (Signed)
 Pt arrived via POV c/o SOB, cough and emesis. Pt endorses weakness and body aches. Pt placed on 2L Nasal Cannula in Triage. Pt presented at 87% on room air.

## 2023-12-21 NOTE — H&P (Signed)
 History and Physical    CORDA SHUTT WUJ:[id number] DOB: 06-Apr-1966 DOA: 12/21/2023  PCP: Ileana Ladd, MD (Inactive)   Patient coming from: Home  Chief Complaint: Shortness of breath  HPI: Ana Austin is a 58 y.o. female with medical history significant for anxiety and chronic pain as well as tobacco abuse presented to the ED with worsening shortness of breath and cough with minimal production over the last 2 days.  She also endorses some nausea and vomiting as well as weakness and bodyaches.  She continues to smoke half a pack per day and denies any sick contacts.  She denies any fevers, chills, or chest pain.  She is concerned about a potential rib fracture to her left side as she has had prior history of fracture related to coughing and believes this may have the case again.   ED Course: Vital signs stable and patient afebrile.  Oxygen saturation in the 80th percentile and therefore she was started on 2 L nasal cannula oxygen.  She is given IV Solu-Medrol as well as breathing treatments in the ED and started on Tamiflu due to positivity for influenza A.  Chest x-ray with hyperinflation noted and no other acute findings.  Review of Systems: Reviewed as noted above, otherwise negative.  Past Medical History:  Diagnosis Date   Anxiety    Chronic back pain     Past Surgical History:  Procedure Laterality Date   CESAREAN SECTION  2010 and 2011   [place], [place]   FEMORAL HERNIA REPAIR  09/15/2011   Procedure: HERNIA REPAIR FEMORAL;  Surgeon: Fabio Bering;  Location: [facility];  Service: General;  Laterality: Right;  WITH MESH    TUBAL LIGATION  2011   with c-section     reports that she has been smoking cigarettes. She has a 5 pack-year smoking history. She has been exposed to tobacco smoke. She has quit using smokeless tobacco. She reports that she does not drink alcohol and does not use drugs.  Allergies  Allergen Reactions   Tramadol Other (See Comments)    Restless Legs    Trazodone And Nefazodone Other (See Comments)    Restless Legs     Family History  Problem Relation Age of Onset   Anesthesia problems Neg Hx    Hypotension Neg Hx    Malignant hyperthermia Neg Hx    Pseudochol deficiency Neg Hx     Prior to Admission medications   Medication Sig Start Date End Date Taking? Authorizing Provider  benzonatate (TESSALON) 100 MG capsule Take 1 capsule (100 mg total) by mouth every 8 (eight) hours. 02/20/14   Teressa Lower, NP  cyclobenzaprine (FLEXERIL) 5 MG tablet Take 1 tablet (5 mg total) by mouth 3 (three) times daily as needed for muscle spasms. 04/05/15   Burgess Amor, PA-C  HYDROcodone-acetaminophen (NORCO/VICODIN) 5-325 MG per tablet Take 1 tablet by mouth every 4 (four) hours as needed. 04/05/15   Burgess Amor, PA-C  naproxen (NAPROSYN) 500 MG tablet Take 1 tablet (500 mg total) by mouth 2 (two) times daily. 04/05/15   Burgess Amor, PA-C    Physical Exam: Vitals:   12/21/23 1245 12/21/23 1300 12/21/23 1315 12/21/23 1407  BP: 124/84 (!) 94/58 104/86   Pulse: 66 64 76   Resp: 19 17 20    Temp:      TempSrc:      SpO2: 96% 93% 92% 94%  Weight:      Height:  Constitutional: NAD, calm, comfortable Vitals:   12/21/23 1245 12/21/23 1300 12/21/23 1315 12/21/23 1407  BP: 124/84 (!) 94/58 104/86   Pulse: 66 64 76   Resp: 19 17 20    Temp:      TempSrc:      SpO2: 96% 93% 92% 94%  Weight:      Height:       Eyes: lids and conjunctivae normal Neck: normal, supple Respiratory: Diminished to auscultation bilaterally. Normal respiratory effort. No accessory muscle use.  2 L nasal cannula Cardiovascular: Regular rate and rhythm, no murmurs. Abdomen: no tenderness, no distention. Bowel sounds positive.  Musculoskeletal:  No edema. Skin: no rashes, lesions, ulcers.  Psychiatric: Flat affect  Labs on Admission: I have personally reviewed following labs and imaging studies  CBC: Recent Labs  Lab 12/21/23 1159  WBC 6.9  NEUTROABS 5.2   HGB 14.7  HCT 43.0  MCV 91.5  PLT 169   Basic Metabolic Panel: Recent Labs  Lab 12/21/23 1159  NA 138  K 3.2*  CL 95*  CO2 29  GLUCOSE 111*  BUN 18  CREATININE 0.73  CALCIUM 9.6   GFR: Estimated Creatinine Clearance: 68.4 mL/min (by C-G formula based on SCr of 0.73 mg/dL). Liver Function Tests: Recent Labs  Lab 12/21/23 1159  AST 23  ALT 14  ALKPHOS 75  BILITOT 0.6  PROT 8.1  ALBUMIN 4.5   No results for input(s): "LIPASE", "AMYLASE" in the last 168 hours. No results for input(s): "AMMONIA" in the last 168 hours. Coagulation Profile: No results for input(s): "INR", "PROTIME" in the last 168 hours. Cardiac Enzymes: No results for input(s): "CKTOTAL", "CKMB", "CKMBINDEX", "TROPONINI" in the last 168 hours. BNP (last 3 results) No results for input(s): "PROBNP" in the last 8760 hours. HbA1C: No results for input(s): "HGBA1C" in the last 72 hours. CBG: No results for input(s): "GLUCAP" in the last 168 hours. Lipid Profile: No results for input(s): "CHOL", "HDL", "LDLCALC", "TRIG", "CHOLHDL", "LDLDIRECT" in the last 72 hours. Thyroid Function Tests: No results for input(s): "TSH", "T4TOTAL", "FREET4", "T3FREE", "THYROIDAB" in the last 72 hours. Anemia Panel: No results for input(s): "VITAMINB12", "FOLATE", "FERRITIN", "TIBC", "IRON", "RETICCTPCT" in the last 72 hours. Urine analysis:    Component Value Date/Time   COLORURINE YELLOW 04/18/2013 0132   APPEARANCEUR CLEAR 04/18/2013 0132   LABSPEC 1.015 04/18/2013 0132   PHURINE 6.0 04/18/2013 0132   GLUCOSEU NEGATIVE 04/18/2013 0132   HGBUR NEGATIVE 04/18/2013 0132   BILIRUBINUR NEGATIVE 04/18/2013 0132   KETONESUR NEGATIVE 04/18/2013 0132   PROTEINUR NEGATIVE 04/18/2013 0132   UROBILINOGEN 0.2 04/18/2013 0132   NITRITE NEGATIVE 04/18/2013 0132   LEUKOCYTESUR NEGATIVE 04/18/2013 0132    Radiological Exams on Admission: DG Chest 2 View Result Date: 12/21/2023 CLINICAL DATA:  Shortness of breath, cough and  emesis EXAM: CHEST - 2 VIEW COMPARISON:  02/20/2014 FINDINGS: Similar hyperinflation with mild streaky bibasilar densities favored to be atelectasis or scarring. No focal pneumonia, collapse or consolidation. Negative for edema, effusion or pneumothorax. Trachea midline. Normal heart size and vascularity. Minor lower thoracic endplate osteophytes noted. IMPRESSION: Hyperinflation with mild bibasilar atelectasis or scarring. Electronically Signed   By: Judie Petit.  Shick M.D.   On: 12/21/2023 13:47    EKG: Independently reviewed.  SR 58 bpm.  Assessment/Plan Principal Problem:   Acute hypoxemic respiratory failure (HCC)    Acute hypoxemic respiratory failure with mild COPD exacerbation in the setting of influenza A -Wean to room air -DuoNebs as needed for shortness of breath or  wheezing -Continue with oral prednisone -Tamiflu -Check left-sided rib x-ray for potential for fracture  Mild hypokalemia -Replete and reevaluate  Anxiety -Continue home medications  Chronic pain -Continue home medication  Ongoing tobacco abuse -Counseled on cessation   DVT prophylaxis: Lovenox Code Status: Full Family Communication: None at bedside Disposition Plan: Admit for hypoxemia with influenza Consults called: None Admission status: Inpatient, telemetry  Severity of Illness: The appropriate patient status for this patient is INPATIENT. Inpatient status is judged to be reasonable and necessary in order to provide the required intensity of service to ensure the patient's safety. The patient's presenting symptoms, physical exam findings, and initial radiographic and laboratory data in the context of their chronic comorbidities is felt to place them at high risk for further clinical deterioration. Furthermore, it is not anticipated that the patient will be medically stable for discharge from the hospital within 2 midnights of admission.   * I certify that at the point of admission it is my clinical judgment  that the patient will require inpatient hospital care spanning beyond 2 midnights from the point of admission due to high intensity of service, high risk for further deterioration and high frequency of surveillance required.*   Ellenore Roscoe D Jakerra Floyd DO [facility]  If 7PM-7AM, please contact night-coverage [email or web address]  12/21/2023, 2:49 PM

## 2023-12-22 DIAGNOSIS — J9601 Acute respiratory failure with hypoxia: Secondary | ICD-10-CM | POA: Diagnosis not present

## 2023-12-22 LAB — BASIC METABOLIC PANEL
Anion gap: 9 (ref 5–15)
BUN: 15 mg/dL (ref 6–20)
CO2: 27 mmol/L (ref 22–32)
Calcium: 9.1 mg/dL (ref 8.9–10.3)
Chloride: 99 mmol/L (ref 98–111)
Creatinine, Ser: 0.68 mg/dL (ref 0.44–1.00)
GFR, Estimated: >60 mL/min (ref >60)
Glucose, Bld: 105 mg/dL — ABNORMAL HIGH (ref 70–99)
Potassium: 3.9 mmol/L (ref 3.5–5.1)
Sodium: 135 mmol/L (ref 135–145)

## 2023-12-22 LAB — CBC
HCT: 40.7 % (ref 36.0–46.0)
Hemoglobin: 13.6 g/dL (ref 12.0–15.0)
MCH: 31.6 pg (ref 26.0–34.0)
MCHC: 33.4 g/dL (ref 30.0–36.0)
MCV: 94.7 fL (ref 80.0–100.0)
Platelets: 155 10*3/uL (ref 150–400)
RBC: 4.3 MIL/uL (ref 3.87–5.11)
RDW: 13.6 % (ref 11.5–15.5)
WBC: 10.4 10*3/uL (ref 4.0–10.5)
nRBC: 0 % (ref 0.0–0.2)

## 2023-12-22 LAB — HIV ANTIBODY (ROUTINE TESTING W REFLEX): HIV Screen 4th Generation wRfx: NONREACTIVE

## 2023-12-22 LAB — MAGNESIUM: Magnesium: 1.8 mg/dL (ref 1.7–2.4)

## 2023-12-22 MED ORDER — ONDANSETRON HCL 4 MG/2ML IJ SOLN
4.0000 mg | Freq: Four times a day (QID) | INTRAMUSCULAR | Status: DC
  Administered 2023-12-22 – 2023-12-23 (×3): 4 mg via INTRAVENOUS
  Filled 2023-12-22 (×3): qty 2

## 2023-12-22 MED ORDER — ONDANSETRON HCL 4 MG PO TABS
4.0000 mg | ORAL_TABLET | Freq: Four times a day (QID) | ORAL | Status: DC
  Administered 2023-12-22 – 2023-12-23 (×2): 4 mg via ORAL
  Filled 2023-12-22 (×2): qty 1

## 2023-12-22 MED ORDER — FENTANYL CITRATE PF 50 MCG/ML IJ SOSY
50.0000 ug | PREFILLED_SYRINGE | Freq: Four times a day (QID) | INTRAMUSCULAR | Status: DC | PRN
  Administered 2023-12-22 – 2023-12-23 (×5): 50 ug via INTRAVENOUS
  Filled 2023-12-22 (×5): qty 1

## 2023-12-22 NOTE — Progress Notes (Signed)
   12/22/23 1407  TOC Brief Assessment  Insurance and Status Reviewed  Patient has primary care physician Yes  Home environment has been reviewed Home with family  Prior level of function: Independent  Prior/Current Home Services No current home services  Social Drivers of Health Review SDOH reviewed no interventions necessary  Readmission risk has been reviewed Yes  Transition of care needs no transition of care needs at this time   TOC following place consult if need arise.

## 2023-12-22 NOTE — Progress Notes (Signed)
 PROGRESS NOTE    Ana Austin  ZOX:[id number] DOB: [birth year] DOA: 12/21/2023 PCP: Ileana Ladd, MD (Inactive)   Brief Narrative:    Ana Austin is a 58 y.o. female with medical history significant for anxiety and chronic pain as well as tobacco abuse presented to the ED with worsening shortness of breath and cough with minimal production over the last 2 days.  She also endorses some nausea and vomiting as well as weakness and bodyaches.  Patient was admitted with acute hypoxemic respiratory failure with mild COPD exacerbation in the setting of influenza A.  She is now struggling with intractable nausea and vomiting as well as pain.  Assessment & Plan:   Principal Problem:   Acute hypoxemic respiratory failure (HCC)  Assessment and Plan:   Acute hypoxemic respiratory failure with mild COPD exacerbation in the setting of influenza A -Wean to room air -DuoNebs as needed for shortness of breath or wheezing -Continue with oral prednisone -Tamiflu -Check left-sided rib x-ray for potential for fracture   Intractable nausea and vomiting -Related to above -Clear liquid diet -Scheduled Zofran every 6 hours   Anxiety -Continue home medications   Chronic pain -Currently not on any outpatient narcotic pain medications -Continues to have some pain and will start on IV fentanyl every 6 as needed   Ongoing tobacco abuse -Counseled on cessation   DVT prophylaxis:Lovenox Code Status: Full Family Communication: None at bedside Disposition Plan:  Status is: Inpatient Remains inpatient appropriate because: Need for IV medications   Consultants:  None  Procedures:  None  Antimicrobials:  Anti-infectives (From admission, onward)    Start     Dose/Rate Route Frequency Ordered Stop   12/22/23 1000  oseltamivir (TAMIFLU) capsule 75 mg        75 mg Oral 2 times daily 12/21/23 1503 12/26/23 2159   12/21/23 1430  oseltamivir (TAMIFLU) capsule 75 mg        75 mg Oral  Once  12/21/23 1425 12/21/23 1447      Subjective: Patient seen and evaluated today with worsening nausea and vomiting.  She is unable to keep anything down and does not want any breakfast.  She is also complaining of significant amounts of pain.  Objective: Vitals:   12/21/23 1705 12/21/23 2033 12/22/23 0057 12/22/23 0603  BP: 112/77 122/76 109/65 (!) 128/59  Pulse: 66 84 (!) 59 (!) 56  Resp: 20 19 19  (!) 21  Temp: 98.7 F (37.1 C) 99 F (37.2 C) 99.6 F (37.6 C) 98.7 F (37.1 C)  TempSrc: Oral Oral Oral   SpO2: 93% 93% 92% 91%  Weight:      Height:       No intake or output data in the 24 hours ending 12/22/23 0913 Filed Weights   12/21/23 1109  Weight: 54.5 kg    Examination:  General exam: Appears calm and comfortable  Respiratory system: Clear to auscultation. Respiratory effort normal.  2 L nasal cannula Cardiovascular system: S1 & S2 heard, RRR.  Gastrointestinal system: Abdomen is soft Central nervous system: Alert and awake Extremities: No edema Skin: No significant lesions noted Psychiatry: Flat affect.    Data Reviewed: I have personally reviewed following labs and imaging studies  CBC: Recent Labs  Lab 12/21/23 1159 12/22/23 0345  WBC 6.9 10.4  NEUTROABS 5.2  --   HGB 14.7 13.6  HCT 43.0 40.7  MCV 91.5 94.7  PLT 169 155   Basic Metabolic Panel: Recent Labs  Lab 12/21/23 1159  12/22/23 0345  NA 138 135  K 3.2* 3.9  CL 95* 99  CO2 29 27  GLUCOSE 111* 105*  BUN 18 15  CREATININE 0.73 0.68  CALCIUM 9.6 9.1  MG  --  1.8   GFR: Estimated Creatinine Clearance: 68.4 mL/min (by C-G formula based on SCr of 0.68 mg/dL). Liver Function Tests: Recent Labs  Lab 12/21/23 1159  AST 23  ALT 14  ALKPHOS 75  BILITOT 0.6  PROT 8.1  ALBUMIN 4.5   No results for input(s): "LIPASE", "AMYLASE" in the last 168 hours. No results for input(s): "AMMONIA" in the last 168 hours. Coagulation Profile: No results for input(s): "INR", "PROTIME" in the last  168 hours. Cardiac Enzymes: No results for input(s): "CKTOTAL", "CKMB", "CKMBINDEX", "TROPONINI" in the last 168 hours. BNP (last 3 results) No results for input(s): "PROBNP" in the last 8760 hours. HbA1C: No results for input(s): "HGBA1C" in the last 72 hours. CBG: No results for input(s): "GLUCAP" in the last 168 hours. Lipid Profile: No results for input(s): "CHOL", "HDL", "LDLCALC", "TRIG", "CHOLHDL", "LDLDIRECT" in the last 72 hours. Thyroid Function Tests: No results for input(s): "TSH", "T4TOTAL", "FREET4", "T3FREE", "THYROIDAB" in the last 72 hours. Anemia Panel: No results for input(s): "VITAMINB12", "FOLATE", "FERRITIN", "TIBC", "IRON", "RETICCTPCT" in the last 72 hours. Sepsis Labs: No results for input(s): "PROCALCITON", "LATICACIDVEN" in the last 168 hours.  Recent Results (from the past 240 hours)  Resp panel by RT-PCR (RSV, Flu A&B, Covid) Anterior Nasal Swab     Status: Abnormal   Collection Time: 12/21/23 11:05 AM   Specimen: Anterior Nasal Swab  Result Value Ref Range Status   SARS Coronavirus 2 by RT PCR NEGATIVE NEGATIVE Final    Comment: (NOTE) SARS-CoV-2 target nucleic acids are NOT DETECTED.  The SARS-CoV-2 RNA is generally detectable in upper respiratory specimens during the acute phase of infection. The lowest concentration of SARS-CoV-2 viral copies this assay can detect is 138 copies/mL. A negative result does not preclude SARS-Cov-2 infection and should not be used as the sole basis for treatment or other patient management decisions. A negative result may occur with  improper specimen collection/handling, submission of specimen other than nasopharyngeal swab, presence of viral mutation(s) within the areas targeted by this assay, and inadequate number of viral copies(<138 copies/mL). A negative result must be combined with clinical observations, patient history, and epidemiological information. The expected result is Negative.  Fact Sheet for  Patients:  BloggerCourse.com  Fact Sheet for Healthcare Providers:  SeriousBroker.it  This test is no t yet approved or cleared by the [place] FDA and  has been authorized for detection and/or diagnosis of SARS-CoV-2 by FDA under an Emergency Use Authorization (EUA). This EUA will remain  in effect (meaning this test can be used) for the duration of the COVID-19 declaration under Section 564(b)(1) of the Act, 21 U.S.C.section 360bbb-3(b)(1), unless the authorization is terminated  or revoked sooner.       Influenza A by PCR POSITIVE (A) NEGATIVE Final   Influenza B by PCR NEGATIVE NEGATIVE Final    Comment: (NOTE) The Xpert Xpress SARS-CoV-2/FLU/RSV plus assay is intended as an aid in the diagnosis of influenza from Nasopharyngeal swab specimens and should not be used as a sole basis for treatment. Nasal washings and aspirates are unacceptable for Xpert Xpress SARS-CoV-2/FLU/RSV testing.  Fact Sheet for Patients: BloggerCourse.com  Fact Sheet for Healthcare Providers: SeriousBroker.it  This test is not yet approved or cleared by the [place] FDA and has  been authorized for detection and/or diagnosis of SARS-CoV-2 by FDA under an Emergency Use Authorization (EUA). This EUA will remain in effect (meaning this test can be used) for the duration of the COVID-19 declaration under Section 564(b)(1) of the Act, 21 U.S.C. section 360bbb-3(b)(1), unless the authorization is terminated or revoked.     Resp Syncytial Virus by PCR NEGATIVE NEGATIVE Final    Comment: (NOTE) Fact Sheet for Patients: BloggerCourse.com  Fact Sheet for Healthcare Providers: SeriousBroker.it  This test is not yet approved or cleared by the [place] FDA and has been authorized for detection and/or diagnosis of SARS-CoV-2 by FDA under an  Emergency Use Authorization (EUA). This EUA will remain in effect (meaning this test can be used) for the duration of the COVID-19 declaration under Section 564(b)(1) of the Act, 21 U.S.C. section 360bbb-3(b)(1), unless the authorization is terminated or revoked.  Performed at [facility], [address], [place], [place] [address]          Radiology Studies: DG Ribs Unilateral Left Result Date: 12/21/2023 CLINICAL DATA:  Left lower rib pain. EXAM: LEFT RIBS - 2 VIEW COMPARISON:  Chest radiographs also obtained today FINDINGS: No fracture or other bone lesions are seen involving the ribs. IMPRESSION: Negative. Electronically Signed   By: Danae Orleans M.D.   On: 12/21/2023 17:52   DG Chest 2 View Result Date: 12/21/2023 CLINICAL DATA:  Shortness of breath, cough and emesis EXAM: CHEST - 2 VIEW COMPARISON:  02/20/2014 FINDINGS: Similar hyperinflation with mild streaky bibasilar densities favored to be atelectasis or scarring. No focal pneumonia, collapse or consolidation. Negative for edema, effusion or pneumothorax. Trachea midline. Normal heart size and vascularity. Minor lower thoracic endplate osteophytes noted. IMPRESSION: Hyperinflation with mild bibasilar atelectasis or scarring. Electronically Signed   By: Judie Petit.  Shick M.D.   On: 12/21/2023 13:47        Scheduled Meds:  enoxaparin (LOVENOX) injection  40 mg Subcutaneous Q24H   ondansetron  4 mg Oral Q6H   Or   ondansetron (ZOFRAN) IV  4 mg Intravenous Q6H   oseltamivir  75 mg Oral BID   predniSONE  40 mg Oral Q breakfast     LOS: 1 day    Time spent: 55 minutes    Sherrina Zaugg Hoover Brunette, DO [facility]  If 7PM-7AM, please contact night-coverage [email or web address] 12/22/2023, 9:13 AM

## 2023-12-22 NOTE — Plan of Care (Signed)
  Problem: Education: Goal: Knowledge of General Education information will improve Description: Including pain rating scale, medication(s)/side effects and non-pharmacologic comfort measures Outcome: Progressing   Problem: Nutrition: Goal: Adequate nutrition will be maintained Outcome: Progressing   Problem: Pain Managment: Goal: General experience of comfort will improve and/or be controlled Outcome: Progressing

## 2023-12-22 NOTE — Plan of Care (Signed)
  Problem: Education: Goal: Knowledge of General Education information will improve Description: Including pain rating scale, medication(s)/side effects and non-pharmacologic comfort measures Outcome: Progressing   Problem: Health Behavior/Discharge Planning: Goal: Ability to manage health-related needs will improve Outcome: Progressing   Problem: Clinical Measurements: Goal: Ability to maintain clinical measurements within normal limits will improve Outcome: Progressing   Problem: Pain Managment: Goal: General experience of comfort will improve and/or be controlled Outcome: Progressing   Problem: Elimination: Goal: Will not experience complications related to bowel motility Outcome: Progressing   Problem: Coping: Goal: Level of anxiety will decrease Outcome: Progressing

## 2023-12-22 NOTE — Progress Notes (Signed)
 Mobility Specialist Progress Note:    12/22/23 1315  Mobility  Activity Refused mobility   Pt politely refused mobility, pt had just gotten comfortable and headache relief. All needs met.   Ana Austin Mobility Specialist Please contact via Special educational needs teacher or  Rehab office at [phone]

## 2023-12-23 DIAGNOSIS — J9601 Acute respiratory failure with hypoxia: Secondary | ICD-10-CM | POA: Diagnosis not present

## 2023-12-23 LAB — COMPREHENSIVE METABOLIC PANEL
ALT: 13 U/L (ref 0–44)
AST: 23 U/L (ref 15–41)
Albumin: 3.9 g/dL (ref 3.5–5.0)
Alkaline Phosphatase: 53 U/L (ref 38–126)
Anion gap: 9 (ref 5–15)
BUN: 14 mg/dL (ref 6–20)
CO2: 29 mmol/L (ref 22–32)
Calcium: 9.1 mg/dL (ref 8.9–10.3)
Chloride: 98 mmol/L (ref 98–111)
Creatinine, Ser: 0.6 mg/dL (ref 0.44–1.00)
GFR, Estimated: >60 mL/min (ref >60)
Glucose, Bld: 88 mg/dL (ref 70–99)
Potassium: 4 mmol/L (ref 3.5–5.1)
Sodium: 136 mmol/L (ref 135–145)
Total Bilirubin: 0.5 mg/dL (ref 0.0–1.2)
Total Protein: 7 g/dL (ref 6.5–8.1)

## 2023-12-23 LAB — CBC
HCT: 42.8 % (ref 36.0–46.0)
Hemoglobin: 13.9 g/dL (ref 12.0–15.0)
MCH: 30.8 pg (ref 26.0–34.0)
MCHC: 32.5 g/dL (ref 30.0–36.0)
MCV: 94.7 fL (ref 80.0–100.0)
Platelets: 155 10*3/uL (ref 150–400)
RBC: 4.52 MIL/uL (ref 3.87–5.11)
RDW: 13.4 % (ref 11.5–15.5)
WBC: 6.2 10*3/uL (ref 4.0–10.5)
nRBC: 0 % (ref 0.0–0.2)

## 2023-12-23 LAB — MAGNESIUM: Magnesium: 2 mg/dL (ref 1.7–2.4)

## 2023-12-23 MED ORDER — ONDANSETRON HCL 4 MG PO TABS: 4.0000 mg | ORAL_TABLET | Freq: Three times a day (TID) | ORAL | 0 refills | Status: AC | PRN

## 2023-12-23 MED ORDER — OSELTAMIVIR PHOSPHATE 75 MG PO CAPS: 75.0000 mg | ORAL_CAPSULE | Freq: Two times a day (BID) | ORAL | 0 refills | Status: AC

## 2023-12-23 MED ORDER — PREDNISONE 20 MG PO TABS: 40.0000 mg | ORAL_TABLET | Freq: Every day | ORAL | 0 refills | Status: AC

## 2023-12-23 MED ORDER — OXYCODONE HCL 5 MG PO TABS: 5.0000 mg | ORAL_TABLET | Freq: Four times a day (QID) | ORAL | 0 refills | Status: AC | PRN

## 2023-12-23 MED ORDER — COMBIVENT RESPIMAT 20-100 MCG/ACT IN AERS: 1.0000 | INHALATION_SPRAY | Freq: Four times a day (QID) | RESPIRATORY_TRACT | 1 refills | Status: AC | PRN

## 2023-12-23 MED ORDER — GUAIFENESIN-DM 100-10 MG/5ML PO SYRP: 5.0000 mL | ORAL_SOLUTION | ORAL | 0 refills | Status: AC | PRN

## 2023-12-23 NOTE — Discharge Summary (Signed)
 Physician Discharge Summary  Ana Austin:[id number] DOB: 1966/03/11 DOA: 12/21/2023  PCP: Ileana Ladd, MD (Inactive)  Admit date: 12/21/2023  Discharge date: 12/23/2023  Admitted From:Home  Disposition:  Home  Recommendations for Outpatient Follow-up:  Follow up with PCP in 1-2 weeks Continue Tamiflu to complete course of treatment along with prednisone Pain medication with oxycodone prescribed for 10 tablets and 0 refills as needed Zofran prescribed as needed for nausea or vomiting  Home Health: None  Equipment/Devices: 2 L nasal cannula oxygen  Discharge Condition:Stable  CODE STATUS: Full  Diet recommendation: Heart Healthy  Brief/Interim Summary: Ana Austin is a 58 y.o. female with medical history significant for anxiety and chronic pain as well as tobacco abuse presented to the ED with worsening shortness of breath and cough with minimal production over the last 2 days.  She also endorses some nausea and vomiting as well as weakness and bodyaches.  Patient was admitted with acute hypoxemic respiratory failure with mild COPD exacerbation in the setting of influenza A.  She has had some intractable nausea and vomiting related to this as well.  She is now able to tolerate diet without any further nausea or vomiting and is still requiring oxygen at the time of discharge which will be arranged.  She is otherwise feeling stable enough for discharge and will follow-up with her PCP.  She will remain on medications as noted above to complete course of treatment.  Discharge Diagnoses:  Principal Problem:   Acute hypoxemic respiratory failure (HCC)  Principal discharge diagnosis: Acute hypoxemic respiratory failure with mild COPD exacerbation in the setting of influenza A with associated intractable nausea and vomiting.  Discharge Instructions  Discharge Instructions     Diet - low sodium heart healthy   Complete by: As directed    Increase activity slowly   Complete  by: As directed       Allergies as of 12/23/2023       Reactions   Tramadol Other (See Comments)   Restless Legs   Trazodone And Nefazodone Other (See Comments)   Restless Legs         Medication List     TAKE these medications    acetaminophen 500 MG tablet Commonly known as: TYLENOL Take 1,000 mg by mouth every 6 (six) hours as needed for moderate pain (pain score 4-6).   ALIVE HAIR, SKIN & NAILS PO Take 2 tablets by mouth daily.   Combivent Respimat 20-100 MCG/ACT Aers respimat Generic drug: Ipratropium-Albuterol Inhale 1 puff into the lungs every 6 (six) hours as needed.   guaiFENesin-dextromethorphan 100-10 MG/5ML syrup Commonly known as: ROBITUSSIN DM Take 5 mLs by mouth every 4 (four) hours as needed for cough.   multivitamin tablet Take 1 tablet by mouth daily.   ondansetron 4 MG tablet Commonly known as: ZOFRAN Take 1 tablet (4 mg total) by mouth every 8 (eight) hours as needed for nausea or vomiting.   oseltamivir 75 MG capsule Commonly known as: TAMIFLU Take 1 capsule (75 mg total) by mouth 2 (two) times daily for 3 days.   oxyCODONE 5 MG immediate release tablet Commonly known as: Roxicodone Take 1 tablet (5 mg total) by mouth every 6 (six) hours as needed for severe pain (pain score 7-10) or breakthrough pain.   predniSONE 20 MG tablet Commonly known as: DELTASONE Take 2 tablets (40 mg total) by mouth daily with breakfast for 3 days. Start taking on: December 24, 2023  Durable Medical Equipment  (From admission, onward)           Start     Ordered   12/23/23 1504  For home use only DME oxygen  Once       Question Answer Comment  Length of Need 12 Months   Mode or (Route) Nasal cannula   Liters per Minute 2   Frequency Continuous (stationary and portable oxygen unit needed)   Oxygen conserving device Yes   Oxygen delivery system Gas      12/23/23 1503            Follow-up Information     [facility] Follow up.    Why: Home oxygen Contact information: [address] [place] [address] [phone]         Ileana Ladd, MD. Schedule an appointment as soon as possible for a visit in 1 week(s).   Specialty: Family Medicine Contact information: [address] [place] [address] [phone]                Allergies  Allergen Reactions   Tramadol Other (See Comments)    Restless Legs   Trazodone And Nefazodone Other (See Comments)    Restless Legs     Consultations: None   Procedures/Studies: DG Ribs Unilateral Left Result Date: 12/21/2023 CLINICAL DATA:  Left lower rib pain. EXAM: LEFT RIBS - 2 VIEW COMPARISON:  Chest radiographs also obtained today FINDINGS: No fracture or other bone lesions are seen involving the ribs. IMPRESSION: Negative. Electronically Signed   By: Danae Orleans M.D.   On: 12/21/2023 17:52   DG Chest 2 View Result Date: 12/21/2023 CLINICAL DATA:  Shortness of breath, cough and emesis EXAM: CHEST - 2 VIEW COMPARISON:  02/20/2014 FINDINGS: Similar hyperinflation with mild streaky bibasilar densities favored to be atelectasis or scarring. No focal pneumonia, collapse or consolidation. Negative for edema, effusion or pneumothorax. Trachea midline. Normal heart size and vascularity. Minor lower thoracic endplate osteophytes noted. IMPRESSION: Hyperinflation with mild bibasilar atelectasis or scarring. Electronically Signed   By: Judie Petit.  Shick M.D.   On: 12/21/2023 13:47     Discharge Exam: Vitals:   12/23/23 0610 12/23/23 1309  BP:  92/64  Pulse:  61  Resp:    Temp:  98.6 F (37 C)  SpO2: 93% 91%   Vitals:   12/22/23 2115 12/23/23 0500 12/23/23 0610 12/23/23 1309  BP: 116/75 110/72  92/64  Pulse: 66   61  Resp:      Temp: 98.9 F (37.2 C) 98 F (36.7 C)  98.6 F (37 C)  TempSrc: Oral     SpO2: 92% 94% 93% 91%  Weight:      Height:        General: Pt is alert, awake, not in acute distress Cardiovascular: RRR, S1/S2 +, no rubs, no  gallops Respiratory: CTA bilaterally, no wheezing, no rhonchi, 2 L nasal cannula Abdominal: Soft, NT, ND, bowel sounds + Extremities: no edema, no cyanosis    The results of significant diagnostics from this hospitalization (including imaging, microbiology, ancillary and laboratory) are listed below for reference.     Microbiology: Recent Results (from the past 240 hours)  Resp panel by RT-PCR (RSV, Flu A&B, Covid) Anterior Nasal Swab     Status: Abnormal   Collection Time: 12/21/23 11:05 AM   Specimen: Anterior Nasal Swab  Result Value Ref Range Status   SARS Coronavirus 2 by RT PCR NEGATIVE NEGATIVE Final    Comment: (  NOTE) SARS-CoV-2 target nucleic acids are NOT DETECTED.  The SARS-CoV-2 RNA is generally detectable in upper respiratory specimens during the acute phase of infection. The lowest concentration of SARS-CoV-2 viral copies this assay can detect is 138 copies/mL. A negative result does not preclude SARS-Cov-2 infection and should not be used as the sole basis for treatment or other patient management decisions. A negative result may occur with  improper specimen collection/handling, submission of specimen other than nasopharyngeal swab, presence of viral mutation(s) within the areas targeted by this assay, and inadequate number of viral copies(<138 copies/mL). A negative result must be combined with clinical observations, patient history, and epidemiological information. The expected result is Negative.  Fact Sheet for Patients:  BloggerCourse.com  Fact Sheet for Healthcare Providers:  SeriousBroker.it  This test is no t yet approved or cleared by the [place] FDA and  has been authorized for detection and/or diagnosis of SARS-CoV-2 by FDA under an Emergency Use Authorization (EUA). This EUA will remain  in effect (meaning this test can be used) for the duration of the COVID-19 declaration under Section  564(b)(1) of the Act, 21 U.S.C.section 360bbb-3(b)(1), unless the authorization is terminated  or revoked sooner.       Influenza A by PCR POSITIVE (A) NEGATIVE Final   Influenza B by PCR NEGATIVE NEGATIVE Final    Comment: (NOTE) The Xpert Xpress SARS-CoV-2/FLU/RSV plus assay is intended as an aid in the diagnosis of influenza from Nasopharyngeal swab specimens and should not be used as a sole basis for treatment. Nasal washings and aspirates are unacceptable for Xpert Xpress SARS-CoV-2/FLU/RSV testing.  Fact Sheet for Patients: BloggerCourse.com  Fact Sheet for Healthcare Providers: SeriousBroker.it  This test is not yet approved or cleared by the [place] FDA and has been authorized for detection and/or diagnosis of SARS-CoV-2 by FDA under an Emergency Use Authorization (EUA). This EUA will remain in effect (meaning this test can be used) for the duration of the COVID-19 declaration under Section 564(b)(1) of the Act, 21 U.S.C. section 360bbb-3(b)(1), unless the authorization is terminated or revoked.     Resp Syncytial Virus by PCR NEGATIVE NEGATIVE Final    Comment: (NOTE) Fact Sheet for Patients: BloggerCourse.com  Fact Sheet for Healthcare Providers: SeriousBroker.it  This test is not yet approved or cleared by the [place] FDA and has been authorized for detection and/or diagnosis of SARS-CoV-2 by FDA under an Emergency Use Authorization (EUA). This EUA will remain in effect (meaning this test can be used) for the duration of the COVID-19 declaration under Section 564(b)(1) of the Act, 21 U.S.C. section 360bbb-3(b)(1), unless the authorization is terminated or revoked.  Performed at [facility], [address], [place], [place] [address]      Labs: BNP (last 3 results) No results for input(s): "BNP" in the last 8760 hours. Basic Metabolic  Panel: Recent Labs  Lab 12/21/23 1159 12/22/23 0345 12/23/23 0345  NA 138 135 136  K 3.2* 3.9 4.0  CL 95* 99 98  CO2 29 27 29   GLUCOSE 111* 105* 88  BUN 18 15 14   CREATININE 0.73 0.68 0.60  CALCIUM 9.6 9.1 9.1  MG  --  1.8 2.0   Liver Function Tests: Recent Labs  Lab 12/21/23 1159 12/23/23 0345  AST 23 23  ALT 14 13  ALKPHOS 75 53  BILITOT 0.6 0.5  PROT 8.1 7.0  ALBUMIN 4.5 3.9   No results for input(s): "LIPASE", "AMYLASE" in the last 168 hours. No results for input(s): "  AMMONIA" in the last 168 hours. CBC: Recent Labs  Lab 12/21/23 1159 12/22/23 0345 12/23/23 0345  WBC 6.9 10.4 6.2  NEUTROABS 5.2  --   --   HGB 14.7 13.6 13.9  HCT 43.0 40.7 42.8  MCV 91.5 94.7 94.7  PLT 169 155 155   Cardiac Enzymes: No results for input(s): "CKTOTAL", "CKMB", "CKMBINDEX", "TROPONINI" in the last 168 hours. BNP: Invalid input(s): "POCBNP" CBG: No results for input(s): "GLUCAP" in the last 168 hours. D-Dimer No results for input(s): "DDIMER" in the last 72 hours. Hgb A1c No results for input(s): "HGBA1C" in the last 72 hours. Lipid Profile No results for input(s): "CHOL", "HDL", "LDLCALC", "TRIG", "CHOLHDL", "LDLDIRECT" in the last 72 hours. Thyroid function studies No results for input(s): "TSH", "T4TOTAL", "T3FREE", "THYROIDAB" in the last 72 hours.  Invalid input(s): "FREET3" Anemia work up No results for input(s): "VITAMINB12", "FOLATE", "FERRITIN", "TIBC", "IRON", "RETICCTPCT" in the last 72 hours. Urinalysis    Component Value Date/Time   COLORURINE YELLOW 04/18/2013 0132   APPEARANCEUR CLEAR 04/18/2013 0132   LABSPEC 1.015 04/18/2013 0132   PHURINE 6.0 04/18/2013 0132   GLUCOSEU NEGATIVE 04/18/2013 0132   HGBUR NEGATIVE 04/18/2013 0132   BILIRUBINUR NEGATIVE 04/18/2013 0132   KETONESUR NEGATIVE 04/18/2013 0132   PROTEINUR NEGATIVE 04/18/2013 0132   UROBILINOGEN 0.2 04/18/2013 0132   NITRITE NEGATIVE 04/18/2013 0132   LEUKOCYTESUR NEGATIVE 04/18/2013  0132   Sepsis Labs Recent Labs  Lab 12/21/23 1159 12/22/23 0345 12/23/23 0345  WBC 6.9 10.4 6.2   Microbiology Recent Results (from the past 240 hours)  Resp panel by RT-PCR (RSV, Flu A&B, Covid) Anterior Nasal Swab     Status: Abnormal   Collection Time: 12/21/23 11:05 AM   Specimen: Anterior Nasal Swab  Result Value Ref Range Status   SARS Coronavirus 2 by RT PCR NEGATIVE NEGATIVE Final    Comment: (NOTE) SARS-CoV-2 target nucleic acids are NOT DETECTED.  The SARS-CoV-2 RNA is generally detectable in upper respiratory specimens during the acute phase of infection. The lowest concentration of SARS-CoV-2 viral copies this assay can detect is 138 copies/mL. A negative result does not preclude SARS-Cov-2 infection and should not be used as the sole basis for treatment or other patient management decisions. A negative result may occur with  improper specimen collection/handling, submission of specimen other than nasopharyngeal swab, presence of viral mutation(s) within the areas targeted by this assay, and inadequate number of viral copies(<138 copies/mL). A negative result must be combined with clinical observations, patient history, and epidemiological information. The expected result is Negative.  Fact Sheet for Patients:  BloggerCourse.com  Fact Sheet for Healthcare Providers:  SeriousBroker.it  This test is no t yet approved or cleared by the [place] FDA and  has been authorized for detection and/or diagnosis of SARS-CoV-2 by FDA under an Emergency Use Authorization (EUA). This EUA will remain  in effect (meaning this test can be used) for the duration of the COVID-19 declaration under Section 564(b)(1) of the Act, 21 U.S.C.section 360bbb-3(b)(1), unless the authorization is terminated  or revoked sooner.       Influenza A by PCR POSITIVE (A) NEGATIVE Final   Influenza B by PCR NEGATIVE NEGATIVE Final     Comment: (NOTE) The Xpert Xpress SARS-CoV-2/FLU/RSV plus assay is intended as an aid in the diagnosis of influenza from Nasopharyngeal swab specimens and should not be used as a sole basis for treatment. Nasal washings and aspirates are unacceptable for Xpert Xpress SARS-CoV-2/FLU/RSV testing.  Fact Sheet  for Patients: BloggerCourse.com  Fact Sheet for Healthcare Providers: SeriousBroker.it  This test is not yet approved or cleared by the [place] FDA and has been authorized for detection and/or diagnosis of SARS-CoV-2 by FDA under an Emergency Use Authorization (EUA). This EUA will remain in effect (meaning this test can be used) for the duration of the COVID-19 declaration under Section 564(b)(1) of the Act, 21 U.S.C. section 360bbb-3(b)(1), unless the authorization is terminated or revoked.     Resp Syncytial Virus by PCR NEGATIVE NEGATIVE Final    Comment: (NOTE) Fact Sheet for Patients: BloggerCourse.com  Fact Sheet for Healthcare Providers: SeriousBroker.it  This test is not yet approved or cleared by the [place] FDA and has been authorized for detection and/or diagnosis of SARS-CoV-2 by FDA under an Emergency Use Authorization (EUA). This EUA will remain in effect (meaning this test can be used) for the duration of the COVID-19 declaration under Section 564(b)(1) of the Act, 21 U.S.C. section 360bbb-3(b)(1), unless the authorization is terminated or revoked.  Performed at [facility], [address] [place], [place] [address]      Time coordinating discharge: 35 minutes  SIGNED:   Erick Blinks, DO [facility] 12/23/2023, 3:08 PM  If 7PM-7AM, please contact night-coverage [email or web address]

## 2023-12-23 NOTE — Progress Notes (Addendum)
 SATURATION QUALIFICATIONS: (This note is used to comply with regulatory documentation for home oxygen)  Patient Saturations on Room Air at Rest = 87%  Patient Saturations on Room Air while Ambulating = NA%  Patient Saturations on 2 Liters of oxygen while at rest 95%  Please briefly explain why patient needs home oxygen:  Attempt to wean patient off O2. Nasal cannula removed and patient on RA  for 10 minutes. At rest patient SpO2 87%. Ambulation on RA without oxygen not attempted due to current SpO2.

## 2023-12-23 NOTE — TOC Transition Note (Signed)
 Transition of Care [facility]) - Discharge Note   Patient Details  Name: Ana Austin MRN: [id number] Date of Birth: 06/20/1966  [facility]) CM/SW Contact:  Leitha Bleak, RN Phone Number: 12/23/2023, 3:20 PM   Clinical Narrative:   Patient discharging home requiring home oxygen. MD ordered, RN completed sat qualifying note. CMS options reviewed. Ashly with [facility] accepted the referral. RN Updated, patient has a tank for discharge and Ashly will set up home visit.     Final next level of care: Home/Self Care Barriers to Discharge: Barriers Resolved   Patient Goals and CMS Choice Patient states their goals for this hospitalization and ongoing recovery are:: return home CMS Medicare.gov Compare Post Acute Care list provided to:: Patient        Discharge Placement                    Patient and family notified of of transfer: 12/23/23  Discharge Plan and Services Additional resources added to the After Visit Summary for                  DME Arranged: Oxygen DME Agency: Patsy Lager Date DME Agency Contacted: 12/23/23 Time DME Agency Contacted: 1420 Representative spoke with at DME Agency: Kathie Rhodes            Social Drivers of Health (SDOH) Interventions SDOH Screenings   Food Insecurity: No Food Insecurity (12/21/2023)  Housing: Low Risk  (12/21/2023)  Transportation Needs: No Transportation Needs (12/21/2023)  Utilities: Not At Risk (12/21/2023)  Tobacco Use: High Risk (12/21/2023)

## 2023-12-23 NOTE — Progress Notes (Signed)
 Patient has had no acute events overnight.  Patient still has cough and congested and c/o heaviness in head.  Oxygen has been stable on 2 liters with saturations int he 90s.  No acute events overnight.
